# Patient Record
Sex: Female | Born: 1975 | Race: Black or African American | Hispanic: No | Smoking: Former smoker
Health system: Southern US, Community
[De-identification: ages and names within clinical notes are randomized; demographics above are authoritative.]

## PROBLEM LIST (undated history)

## (undated) DIAGNOSIS — D573 Sickle-cell trait: Secondary | ICD-10-CM

## (undated) HISTORY — DX: Sickle-cell trait: D57.3

---

## 1997-09-15 ENCOUNTER — Emergency Department (HOSPITAL_COMMUNITY): Admission: EM | Admit: 1997-09-15 | Discharge: 1997-09-15 | Payer: Self-pay | Admitting: Emergency Medicine

## 1998-01-27 HISTORY — PX: CHOLECYSTECTOMY: SHX55

## 1998-02-07 ENCOUNTER — Ambulatory Visit (HOSPITAL_COMMUNITY): Admission: RE | Admit: 1998-02-07 | Discharge: 1998-02-07 | Payer: Self-pay | Admitting: Obstetrics

## 1998-04-20 ENCOUNTER — Inpatient Hospital Stay (HOSPITAL_COMMUNITY): Admission: AD | Admit: 1998-04-20 | Discharge: 1998-04-20 | Payer: Self-pay | Admitting: Obstetrics & Gynecology

## 1998-05-14 ENCOUNTER — Encounter: Payer: Self-pay | Admitting: Emergency Medicine

## 1998-05-14 ENCOUNTER — Emergency Department (HOSPITAL_COMMUNITY): Admission: EM | Admit: 1998-05-14 | Discharge: 1998-05-14 | Payer: Self-pay | Admitting: Emergency Medicine

## 1998-05-15 ENCOUNTER — Emergency Department (HOSPITAL_COMMUNITY): Admission: EM | Admit: 1998-05-15 | Discharge: 1998-05-15 | Payer: Self-pay | Admitting: Emergency Medicine

## 1998-07-05 ENCOUNTER — Inpatient Hospital Stay (HOSPITAL_COMMUNITY): Admission: AD | Admit: 1998-07-05 | Discharge: 1998-07-05 | Payer: Self-pay | Admitting: *Deleted

## 1998-07-07 ENCOUNTER — Inpatient Hospital Stay (HOSPITAL_COMMUNITY): Admission: AD | Admit: 1998-07-07 | Discharge: 1998-07-10 | Payer: Self-pay | Admitting: *Deleted

## 1998-07-21 ENCOUNTER — Emergency Department (HOSPITAL_COMMUNITY): Admission: EM | Admit: 1998-07-21 | Discharge: 1998-07-21 | Payer: Self-pay | Admitting: Emergency Medicine

## 1999-01-12 ENCOUNTER — Emergency Department (HOSPITAL_COMMUNITY): Admission: EM | Admit: 1999-01-12 | Discharge: 1999-01-12 | Payer: Self-pay | Admitting: Emergency Medicine

## 1999-03-18 ENCOUNTER — Emergency Department (HOSPITAL_COMMUNITY): Admission: EM | Admit: 1999-03-18 | Discharge: 1999-03-18 | Payer: Self-pay | Admitting: Emergency Medicine

## 1999-04-12 ENCOUNTER — Emergency Department (HOSPITAL_COMMUNITY): Admission: EM | Admit: 1999-04-12 | Discharge: 1999-04-12 | Payer: Self-pay | Admitting: Emergency Medicine

## 1999-10-22 ENCOUNTER — Inpatient Hospital Stay (HOSPITAL_COMMUNITY): Admission: AD | Admit: 1999-10-22 | Discharge: 1999-10-22 | Payer: Self-pay | Admitting: *Deleted

## 1999-10-23 ENCOUNTER — Inpatient Hospital Stay (HOSPITAL_COMMUNITY): Admission: RE | Admit: 1999-10-23 | Discharge: 1999-10-23 | Payer: Self-pay | Admitting: *Deleted

## 2000-02-10 ENCOUNTER — Inpatient Hospital Stay (HOSPITAL_COMMUNITY): Admission: AD | Admit: 2000-02-10 | Discharge: 2000-02-10 | Payer: Self-pay | Admitting: Obstetrics

## 2000-02-11 ENCOUNTER — Inpatient Hospital Stay (HOSPITAL_COMMUNITY): Admission: RE | Admit: 2000-02-11 | Discharge: 2000-02-11 | Payer: Self-pay | Admitting: *Deleted

## 2000-03-04 ENCOUNTER — Ambulatory Visit (HOSPITAL_COMMUNITY): Admission: RE | Admit: 2000-03-04 | Discharge: 2000-03-04 | Payer: Self-pay | Admitting: *Deleted

## 2000-03-06 ENCOUNTER — Emergency Department (HOSPITAL_COMMUNITY): Admission: EM | Admit: 2000-03-06 | Discharge: 2000-03-06 | Payer: Self-pay | Admitting: Emergency Medicine

## 2000-03-17 ENCOUNTER — Inpatient Hospital Stay (HOSPITAL_COMMUNITY): Admission: AD | Admit: 2000-03-17 | Discharge: 2000-03-17 | Payer: Self-pay | Admitting: *Deleted

## 2000-03-20 ENCOUNTER — Inpatient Hospital Stay (HOSPITAL_COMMUNITY): Admission: AD | Admit: 2000-03-20 | Discharge: 2000-03-20 | Payer: Self-pay | Admitting: Obstetrics & Gynecology

## 2000-03-22 ENCOUNTER — Inpatient Hospital Stay (HOSPITAL_COMMUNITY): Admission: AD | Admit: 2000-03-22 | Discharge: 2000-03-22 | Payer: Self-pay | Admitting: *Deleted

## 2000-03-23 ENCOUNTER — Inpatient Hospital Stay (HOSPITAL_COMMUNITY): Admission: AD | Admit: 2000-03-23 | Discharge: 2000-03-25 | Payer: Self-pay | Admitting: Obstetrics & Gynecology

## 2000-05-14 ENCOUNTER — Emergency Department (HOSPITAL_COMMUNITY): Admission: EM | Admit: 2000-05-14 | Discharge: 2000-05-14 | Payer: Self-pay | Admitting: Emergency Medicine

## 2000-07-22 ENCOUNTER — Emergency Department (HOSPITAL_COMMUNITY): Admission: EM | Admit: 2000-07-22 | Discharge: 2000-07-22 | Payer: Self-pay | Admitting: Emergency Medicine

## 2002-06-18 ENCOUNTER — Encounter (INDEPENDENT_AMBULATORY_CARE_PROVIDER_SITE_OTHER): Payer: Self-pay

## 2002-06-19 ENCOUNTER — Encounter: Payer: Self-pay | Admitting: Obstetrics and Gynecology

## 2002-06-19 ENCOUNTER — Inpatient Hospital Stay (HOSPITAL_COMMUNITY): Admission: AD | Admit: 2002-06-19 | Discharge: 2002-06-19 | Payer: Self-pay | Admitting: *Deleted

## 2003-02-21 ENCOUNTER — Inpatient Hospital Stay (HOSPITAL_COMMUNITY): Admission: AD | Admit: 2003-02-21 | Discharge: 2003-02-21 | Payer: Self-pay | Admitting: *Deleted

## 2003-11-26 ENCOUNTER — Inpatient Hospital Stay (HOSPITAL_COMMUNITY): Admission: AD | Admit: 2003-11-26 | Discharge: 2003-11-26 | Payer: Self-pay | Admitting: Obstetrics and Gynecology

## 2004-08-07 ENCOUNTER — Emergency Department (HOSPITAL_COMMUNITY): Admission: EM | Admit: 2004-08-07 | Discharge: 2004-08-08 | Payer: Self-pay | Admitting: Emergency Medicine

## 2005-01-10 ENCOUNTER — Emergency Department (HOSPITAL_COMMUNITY): Admission: EM | Admit: 2005-01-10 | Discharge: 2005-01-10 | Payer: Self-pay | Admitting: Emergency Medicine

## 2005-02-03 ENCOUNTER — Inpatient Hospital Stay (HOSPITAL_COMMUNITY): Admission: AD | Admit: 2005-02-03 | Discharge: 2005-02-03 | Payer: Self-pay | Admitting: Family Medicine

## 2005-03-18 ENCOUNTER — Inpatient Hospital Stay (HOSPITAL_COMMUNITY): Admission: AD | Admit: 2005-03-18 | Discharge: 2005-03-18 | Payer: Self-pay | Admitting: *Deleted

## 2005-04-02 ENCOUNTER — Encounter (INDEPENDENT_AMBULATORY_CARE_PROVIDER_SITE_OTHER): Payer: Self-pay | Admitting: *Deleted

## 2005-04-03 ENCOUNTER — Ambulatory Visit: Payer: Self-pay | Admitting: Family Medicine

## 2005-04-10 ENCOUNTER — Ambulatory Visit: Payer: Self-pay | Admitting: Family Medicine

## 2005-04-14 ENCOUNTER — Ambulatory Visit (HOSPITAL_COMMUNITY): Admission: RE | Admit: 2005-04-14 | Discharge: 2005-04-14 | Payer: Self-pay | Admitting: Family Medicine

## 2005-05-09 ENCOUNTER — Ambulatory Visit (HOSPITAL_COMMUNITY): Admission: RE | Admit: 2005-05-09 | Discharge: 2005-05-09 | Payer: Self-pay | Admitting: Family Medicine

## 2005-05-13 ENCOUNTER — Ambulatory Visit (HOSPITAL_COMMUNITY): Admission: RE | Admit: 2005-05-13 | Discharge: 2005-05-13 | Payer: Self-pay | Admitting: *Deleted

## 2005-05-13 ENCOUNTER — Ambulatory Visit: Payer: Self-pay | Admitting: *Deleted

## 2005-05-15 ENCOUNTER — Encounter (INDEPENDENT_AMBULATORY_CARE_PROVIDER_SITE_OTHER): Payer: Self-pay | Admitting: Specialist

## 2005-05-15 ENCOUNTER — Ambulatory Visit (HOSPITAL_COMMUNITY): Admission: RE | Admit: 2005-05-15 | Discharge: 2005-05-15 | Payer: Self-pay | Admitting: Gynecology

## 2005-05-15 ENCOUNTER — Ambulatory Visit: Payer: Self-pay | Admitting: Gynecology

## 2005-10-08 ENCOUNTER — Ambulatory Visit: Payer: Self-pay | Admitting: Sports Medicine

## 2005-10-21 ENCOUNTER — Ambulatory Visit: Payer: Self-pay | Admitting: Family Medicine

## 2005-10-23 ENCOUNTER — Ambulatory Visit (HOSPITAL_COMMUNITY): Admission: RE | Admit: 2005-10-23 | Discharge: 2005-10-23 | Payer: Self-pay | Admitting: Gynecology

## 2005-10-30 ENCOUNTER — Ambulatory Visit: Payer: Self-pay | Admitting: Gynecology

## 2005-11-13 ENCOUNTER — Ambulatory Visit: Payer: Self-pay | Admitting: Family Medicine

## 2005-11-27 ENCOUNTER — Ambulatory Visit (HOSPITAL_COMMUNITY): Admission: RE | Admit: 2005-11-27 | Discharge: 2005-11-27 | Payer: Self-pay | Admitting: Gynecology

## 2005-12-11 ENCOUNTER — Ambulatory Visit: Payer: Self-pay | Admitting: Family Medicine

## 2006-01-01 ENCOUNTER — Ambulatory Visit: Payer: Self-pay | Admitting: *Deleted

## 2006-02-26 ENCOUNTER — Ambulatory Visit: Payer: Self-pay | Admitting: *Deleted

## 2006-03-18 ENCOUNTER — Inpatient Hospital Stay (HOSPITAL_COMMUNITY): Admission: AD | Admit: 2006-03-18 | Discharge: 2006-03-18 | Payer: Self-pay | Admitting: Obstetrics & Gynecology

## 2006-03-26 DIAGNOSIS — D573 Sickle-cell trait: Secondary | ICD-10-CM | POA: Insufficient documentation

## 2006-03-27 ENCOUNTER — Encounter (INDEPENDENT_AMBULATORY_CARE_PROVIDER_SITE_OTHER): Payer: Self-pay | Admitting: *Deleted

## 2006-04-13 ENCOUNTER — Emergency Department (HOSPITAL_COMMUNITY): Admission: EM | Admit: 2006-04-13 | Discharge: 2006-04-13 | Payer: Self-pay | Admitting: Emergency Medicine

## 2006-04-15 ENCOUNTER — Ambulatory Visit: Payer: Self-pay | Admitting: *Deleted

## 2006-04-21 ENCOUNTER — Inpatient Hospital Stay (HOSPITAL_COMMUNITY): Admission: AD | Admit: 2006-04-21 | Discharge: 2006-04-22 | Payer: Self-pay | Admitting: Obstetrics & Gynecology

## 2006-04-21 ENCOUNTER — Ambulatory Visit: Payer: Self-pay | Admitting: Obstetrics and Gynecology

## 2006-08-04 ENCOUNTER — Emergency Department (HOSPITAL_COMMUNITY): Admission: EM | Admit: 2006-08-04 | Discharge: 2006-08-04 | Payer: Self-pay | Admitting: Emergency Medicine

## 2006-08-10 ENCOUNTER — Encounter (INDEPENDENT_AMBULATORY_CARE_PROVIDER_SITE_OTHER): Payer: Self-pay | Admitting: *Deleted

## 2006-09-28 ENCOUNTER — Emergency Department (HOSPITAL_COMMUNITY): Admission: EM | Admit: 2006-09-28 | Discharge: 2006-09-28 | Payer: Self-pay | Admitting: Family Medicine

## 2006-12-26 ENCOUNTER — Emergency Department (HOSPITAL_COMMUNITY): Admission: EM | Admit: 2006-12-26 | Discharge: 2006-12-26 | Payer: Self-pay

## 2006-12-30 ENCOUNTER — Emergency Department (HOSPITAL_COMMUNITY): Admission: EM | Admit: 2006-12-30 | Discharge: 2006-12-30 | Payer: Self-pay | Admitting: Family Medicine

## 2007-01-26 ENCOUNTER — Inpatient Hospital Stay (HOSPITAL_COMMUNITY): Admission: AD | Admit: 2007-01-26 | Discharge: 2007-01-27 | Payer: Self-pay | Admitting: Obstetrics & Gynecology

## 2007-02-07 ENCOUNTER — Emergency Department (HOSPITAL_COMMUNITY): Admission: EM | Admit: 2007-02-07 | Discharge: 2007-02-07 | Payer: Self-pay | Admitting: Emergency Medicine

## 2007-03-11 ENCOUNTER — Ambulatory Visit: Payer: Self-pay | Admitting: Family Medicine

## 2007-03-11 ENCOUNTER — Encounter: Payer: Self-pay | Admitting: Family Medicine

## 2007-03-11 LAB — CONVERTED CEMR LAB
Basophils Absolute: 0 10*3/uL (ref 0.0–0.1)
Basophils Relative: 0 % (ref 0–1)
Eosinophils Relative: 2 % (ref 0–5)
Hemoglobin: 10.4 g/dL — ABNORMAL LOW (ref 12.0–15.0)
Hepatitis B Surface Ag: NEGATIVE
Lymphs Abs: 2.8 10*3/uL (ref 0.7–4.0)
Monocytes Relative: 7 % (ref 3–12)
Neutro Abs: 9.8 10*3/uL — ABNORMAL HIGH (ref 1.7–7.7)
Platelets: 339 10*3/uL (ref 150–400)
RDW: 16.8 % — ABNORMAL HIGH (ref 11.5–15.5)
Rubella: 61 intl units/mL — ABNORMAL HIGH

## 2007-03-23 ENCOUNTER — Encounter: Payer: Self-pay | Admitting: Family Medicine

## 2007-03-23 ENCOUNTER — Other Ambulatory Visit: Admission: RE | Admit: 2007-03-23 | Discharge: 2007-03-23 | Payer: Self-pay | Admitting: Obstetrics and Gynecology

## 2007-03-23 ENCOUNTER — Ambulatory Visit: Payer: Self-pay | Admitting: Family Medicine

## 2007-03-23 LAB — CONVERTED CEMR LAB: Protein, U semiquant: NEGATIVE

## 2007-03-24 ENCOUNTER — Encounter: Payer: Self-pay | Admitting: Family Medicine

## 2007-03-25 ENCOUNTER — Encounter: Payer: Self-pay | Admitting: Family Medicine

## 2007-03-26 ENCOUNTER — Telehealth: Payer: Self-pay | Admitting: Family Medicine

## 2007-03-31 ENCOUNTER — Ambulatory Visit: Payer: Self-pay | Admitting: Family Medicine

## 2007-04-02 ENCOUNTER — Telehealth: Payer: Self-pay | Admitting: Family Medicine

## 2007-04-02 ENCOUNTER — Encounter: Payer: Self-pay | Admitting: Family Medicine

## 2007-04-05 ENCOUNTER — Telehealth: Payer: Self-pay | Admitting: Family Medicine

## 2007-04-06 ENCOUNTER — Encounter: Payer: Self-pay | Admitting: Family Medicine

## 2007-04-06 ENCOUNTER — Telehealth (INDEPENDENT_AMBULATORY_CARE_PROVIDER_SITE_OTHER): Payer: Self-pay | Admitting: Family Medicine

## 2007-04-06 ENCOUNTER — Ambulatory Visit (HOSPITAL_COMMUNITY): Admission: RE | Admit: 2007-04-06 | Discharge: 2007-04-06 | Payer: Self-pay | Admitting: Family Medicine

## 2007-04-08 ENCOUNTER — Telehealth (INDEPENDENT_AMBULATORY_CARE_PROVIDER_SITE_OTHER): Payer: Self-pay | Admitting: Family Medicine

## 2007-04-14 ENCOUNTER — Ambulatory Visit (HOSPITAL_COMMUNITY): Admission: RE | Admit: 2007-04-14 | Discharge: 2007-04-14 | Payer: Self-pay | Admitting: Family Medicine

## 2007-04-21 ENCOUNTER — Ambulatory Visit (HOSPITAL_COMMUNITY): Admission: RE | Admit: 2007-04-21 | Discharge: 2007-04-21 | Payer: Self-pay | Admitting: Family Medicine

## 2007-04-22 ENCOUNTER — Encounter: Payer: Self-pay | Admitting: Family Medicine

## 2007-04-29 ENCOUNTER — Encounter: Payer: Self-pay | Admitting: Family Medicine

## 2007-05-12 ENCOUNTER — Ambulatory Visit (HOSPITAL_COMMUNITY): Admission: RE | Admit: 2007-05-12 | Discharge: 2007-05-12 | Payer: Self-pay | Admitting: Family Medicine

## 2007-05-12 ENCOUNTER — Encounter: Payer: Self-pay | Admitting: Family Medicine

## 2007-05-24 ENCOUNTER — Encounter: Payer: Self-pay | Admitting: *Deleted

## 2007-06-10 ENCOUNTER — Ambulatory Visit: Payer: Self-pay | Admitting: Family Medicine

## 2007-06-10 ENCOUNTER — Encounter: Payer: Self-pay | Admitting: Family Medicine

## 2007-06-10 LAB — CONVERTED CEMR LAB: Protein, U semiquant: NEGATIVE

## 2007-06-14 ENCOUNTER — Ambulatory Visit: Payer: Self-pay | Admitting: Family Medicine

## 2007-06-14 ENCOUNTER — Encounter: Payer: Self-pay | Admitting: Family Medicine

## 2007-06-15 ENCOUNTER — Telehealth: Payer: Self-pay | Admitting: Family Medicine

## 2007-06-16 ENCOUNTER — Encounter: Payer: Self-pay | Admitting: Family Medicine

## 2007-06-24 ENCOUNTER — Encounter: Payer: Self-pay | Admitting: Family Medicine

## 2007-06-24 ENCOUNTER — Ambulatory Visit (HOSPITAL_COMMUNITY): Admission: RE | Admit: 2007-06-24 | Discharge: 2007-06-24 | Payer: Self-pay | Admitting: Family Medicine

## 2007-07-19 ENCOUNTER — Telehealth (INDEPENDENT_AMBULATORY_CARE_PROVIDER_SITE_OTHER): Payer: Self-pay | Admitting: *Deleted

## 2007-07-22 ENCOUNTER — Ambulatory Visit (HOSPITAL_COMMUNITY): Admission: RE | Admit: 2007-07-22 | Discharge: 2007-07-22 | Payer: Self-pay | Admitting: Family Medicine

## 2007-07-27 ENCOUNTER — Ambulatory Visit: Payer: Self-pay | Admitting: Obstetrics and Gynecology

## 2007-07-27 ENCOUNTER — Inpatient Hospital Stay (HOSPITAL_COMMUNITY): Admission: AD | Admit: 2007-07-27 | Discharge: 2007-07-27 | Payer: Self-pay | Admitting: Obstetrics & Gynecology

## 2007-07-27 ENCOUNTER — Encounter: Payer: Self-pay | Admitting: Family Medicine

## 2007-08-09 ENCOUNTER — Encounter (INDEPENDENT_AMBULATORY_CARE_PROVIDER_SITE_OTHER): Payer: Self-pay | Admitting: *Deleted

## 2007-08-11 ENCOUNTER — Encounter: Payer: Self-pay | Admitting: Family Medicine

## 2007-08-19 ENCOUNTER — Ambulatory Visit (HOSPITAL_COMMUNITY): Admission: RE | Admit: 2007-08-19 | Discharge: 2007-08-19 | Payer: Self-pay | Admitting: Family Medicine

## 2007-08-20 ENCOUNTER — Encounter: Payer: Self-pay | Admitting: Family Medicine

## 2007-08-24 ENCOUNTER — Encounter: Payer: Self-pay | Admitting: Family Medicine

## 2007-08-30 ENCOUNTER — Ambulatory Visit: Payer: Self-pay | Admitting: Family Medicine

## 2007-08-30 ENCOUNTER — Encounter: Payer: Self-pay | Admitting: Family Medicine

## 2007-08-30 LAB — CONVERTED CEMR LAB
Chlamydia, DNA Probe: NEGATIVE
Glucose, Urine, Semiquant: NEGATIVE

## 2007-08-31 ENCOUNTER — Encounter: Payer: Self-pay | Admitting: Family Medicine

## 2007-09-09 ENCOUNTER — Ambulatory Visit (HOSPITAL_COMMUNITY): Admission: RE | Admit: 2007-09-09 | Discharge: 2007-09-09 | Payer: Self-pay | Admitting: Family Medicine

## 2007-09-09 ENCOUNTER — Encounter: Payer: Self-pay | Admitting: Family Medicine

## 2007-09-18 ENCOUNTER — Ambulatory Visit: Payer: Self-pay | Admitting: Physician Assistant

## 2007-09-18 ENCOUNTER — Inpatient Hospital Stay (HOSPITAL_COMMUNITY): Admission: AD | Admit: 2007-09-18 | Discharge: 2007-09-20 | Payer: Self-pay | Admitting: Family Medicine

## 2007-09-20 ENCOUNTER — Encounter: Payer: Self-pay | Admitting: Family Medicine

## 2007-09-24 ENCOUNTER — Encounter: Payer: Self-pay | Admitting: *Deleted

## 2007-10-27 ENCOUNTER — Encounter: Payer: Self-pay | Admitting: Family Medicine

## 2007-11-10 ENCOUNTER — Encounter: Payer: Self-pay | Admitting: Family Medicine

## 2007-11-13 ENCOUNTER — Encounter: Payer: Self-pay | Admitting: Family Medicine

## 2007-11-25 ENCOUNTER — Encounter: Payer: Self-pay | Admitting: Family Medicine

## 2007-12-13 ENCOUNTER — Ambulatory Visit: Payer: Self-pay | Admitting: Family Medicine

## 2007-12-13 ENCOUNTER — Encounter: Payer: Self-pay | Admitting: Family Medicine

## 2007-12-13 ENCOUNTER — Other Ambulatory Visit: Admission: RE | Admit: 2007-12-13 | Discharge: 2007-12-13 | Payer: Self-pay | Admitting: Family Medicine

## 2007-12-13 DIAGNOSIS — E669 Obesity, unspecified: Secondary | ICD-10-CM | POA: Insufficient documentation

## 2007-12-13 LAB — CONVERTED CEMR LAB
Bilirubin Urine: NEGATIVE
Blood in Urine, dipstick: NEGATIVE
Chlamydia, DNA Probe: NEGATIVE
Cholesterol: 206 mg/dL — ABNORMAL HIGH (ref 0–200)
GC Probe Amp, Genital: NEGATIVE
Ketones, urine, test strip: NEGATIVE
Nitrite: NEGATIVE
Specific Gravity, Urine: 1.015
Urobilinogen, UA: 0.2

## 2007-12-14 ENCOUNTER — Encounter: Payer: Self-pay | Admitting: Family Medicine

## 2007-12-15 ENCOUNTER — Encounter: Payer: Self-pay | Admitting: Family Medicine

## 2007-12-16 ENCOUNTER — Encounter: Payer: Self-pay | Admitting: Family Medicine

## 2008-05-28 ENCOUNTER — Encounter: Payer: Self-pay | Admitting: Family Medicine

## 2008-05-30 ENCOUNTER — Emergency Department (HOSPITAL_COMMUNITY): Admission: EM | Admit: 2008-05-30 | Discharge: 2008-05-30 | Payer: Self-pay | Admitting: Emergency Medicine

## 2008-06-20 ENCOUNTER — Emergency Department (HOSPITAL_COMMUNITY): Admission: EM | Admit: 2008-06-20 | Discharge: 2008-06-21 | Payer: Self-pay | Admitting: Emergency Medicine

## 2008-07-06 ENCOUNTER — Encounter: Payer: Self-pay | Admitting: *Deleted

## 2008-07-06 ENCOUNTER — Ambulatory Visit: Payer: Self-pay | Admitting: Family Medicine

## 2008-07-06 ENCOUNTER — Encounter: Payer: Self-pay | Admitting: Family Medicine

## 2008-07-06 DIAGNOSIS — K8020 Calculus of gallbladder without cholecystitis without obstruction: Secondary | ICD-10-CM | POA: Insufficient documentation

## 2008-07-07 ENCOUNTER — Encounter: Payer: Self-pay | Admitting: *Deleted

## 2008-11-06 ENCOUNTER — Encounter (INDEPENDENT_AMBULATORY_CARE_PROVIDER_SITE_OTHER): Payer: Self-pay | Admitting: General Surgery

## 2008-11-06 ENCOUNTER — Ambulatory Visit (HOSPITAL_COMMUNITY): Admission: RE | Admit: 2008-11-06 | Discharge: 2008-11-06 | Payer: Self-pay | Admitting: General Surgery

## 2009-05-27 ENCOUNTER — Emergency Department (HOSPITAL_COMMUNITY): Admission: EM | Admit: 2009-05-27 | Discharge: 2009-05-28 | Payer: Self-pay | Admitting: Emergency Medicine

## 2009-07-02 ENCOUNTER — Inpatient Hospital Stay (HOSPITAL_COMMUNITY): Admission: AD | Admit: 2009-07-02 | Discharge: 2009-07-02 | Payer: Self-pay | Admitting: Family Medicine

## 2009-08-21 ENCOUNTER — Telehealth: Payer: Self-pay | Admitting: Family Medicine

## 2009-08-30 ENCOUNTER — Encounter: Payer: Self-pay | Admitting: Family Medicine

## 2009-08-30 ENCOUNTER — Ambulatory Visit: Payer: Self-pay | Admitting: Family Medicine

## 2009-08-30 LAB — CONVERTED CEMR LAB
Antibody Screen: NEGATIVE
Basophils Relative: 0 % (ref 0–1)
Eosinophils Absolute: 0.2 10*3/uL (ref 0.0–0.7)
Eosinophils Relative: 2 % (ref 0–5)
HCT: 29.8 % — ABNORMAL LOW (ref 36.0–46.0)
Lymphs Abs: 1.8 10*3/uL (ref 0.7–4.0)
MCHC: 30.5 g/dL (ref 30.0–36.0)
MCV: 69.8 fL — ABNORMAL LOW (ref 78.0–100.0)
Monocytes Absolute: 1 10*3/uL (ref 0.1–1.0)
Monocytes Relative: 10 % (ref 3–12)
RBC: 4.27 M/uL (ref 3.87–5.11)
Rh Type: POSITIVE
Sickle Cell Screen: POSITIVE — AB
WBC: 10 10*3/uL (ref 4.0–10.5)

## 2009-09-05 ENCOUNTER — Ambulatory Visit: Payer: Self-pay | Admitting: Family Medicine

## 2009-09-05 DIAGNOSIS — O09529 Supervision of elderly multigravida, unspecified trimester: Secondary | ICD-10-CM | POA: Insufficient documentation

## 2009-09-05 LAB — CONVERTED CEMR LAB

## 2009-09-07 ENCOUNTER — Inpatient Hospital Stay (HOSPITAL_COMMUNITY): Admission: AD | Admit: 2009-09-07 | Discharge: 2009-09-07 | Payer: Self-pay | Admitting: Family Medicine

## 2009-09-07 ENCOUNTER — Ambulatory Visit: Payer: Self-pay | Admitting: Advanced Practice Midwife

## 2009-09-12 ENCOUNTER — Encounter: Payer: Self-pay | Admitting: Family Medicine

## 2009-09-13 ENCOUNTER — Encounter: Payer: Self-pay | Admitting: *Deleted

## 2009-09-14 ENCOUNTER — Encounter (INDEPENDENT_AMBULATORY_CARE_PROVIDER_SITE_OTHER): Payer: Self-pay | Admitting: *Deleted

## 2009-09-17 ENCOUNTER — Encounter: Payer: Self-pay | Admitting: Family Medicine

## 2009-09-20 ENCOUNTER — Encounter: Payer: Self-pay | Admitting: Family Medicine

## 2009-09-20 ENCOUNTER — Ambulatory Visit: Payer: Self-pay | Admitting: Obstetrics and Gynecology

## 2009-09-27 ENCOUNTER — Ambulatory Visit: Payer: Self-pay | Admitting: Obstetrics and Gynecology

## 2009-10-04 ENCOUNTER — Ambulatory Visit: Payer: Self-pay | Admitting: Obstetrics & Gynecology

## 2009-10-11 ENCOUNTER — Ambulatory Visit: Payer: Self-pay | Admitting: Family Medicine

## 2009-10-11 LAB — CONVERTED CEMR LAB: Chlamydia, DNA Probe: NEGATIVE

## 2009-10-12 ENCOUNTER — Encounter: Payer: Self-pay | Admitting: Family Medicine

## 2009-10-22 ENCOUNTER — Ambulatory Visit: Payer: Self-pay | Admitting: Obstetrics & Gynecology

## 2009-10-29 ENCOUNTER — Ambulatory Visit: Payer: Self-pay | Admitting: Obstetrics & Gynecology

## 2009-10-30 ENCOUNTER — Ambulatory Visit: Payer: Self-pay | Admitting: Advanced Practice Midwife

## 2009-10-30 ENCOUNTER — Inpatient Hospital Stay (HOSPITAL_COMMUNITY): Admission: AD | Admit: 2009-10-30 | Discharge: 2009-11-02 | Payer: Self-pay | Admitting: Obstetrics & Gynecology

## 2010-01-14 ENCOUNTER — Emergency Department (HOSPITAL_COMMUNITY)
Admission: EM | Admit: 2010-01-14 | Discharge: 2010-01-14 | Payer: Self-pay | Source: Home / Self Care | Admitting: Emergency Medicine

## 2010-02-17 ENCOUNTER — Encounter: Payer: Self-pay | Admitting: Family Medicine

## 2010-02-26 NOTE — Assessment & Plan Note (Signed)
Summary: NOB/DSL   Vital Signs:  Patient profile:   35 year old female LMP:     12/27/2008 Height:      63.25 inches Weight:      221 pounds BMI:     38.98 BSA:     2.03 Pulse rate:   91 / minute BP sitting:   106 / 74  Vitals Entered By: Jone Baseman CMA (September 05, 2009 2:00 PM) CC: NOB Is Patient Diabetic? No Pain Assessment Patient in pain? no      LMP (date): 12/27/2008 EDC by LMP==> 10/03/2009 LMP - Character: normal LMP - Reliable? No Menarche (age onset years): 15   Menses interval (days): irreg Menstrual flow (days): irreg On BCP's at conception: no Date of + home preg. test: 01/26/2007 Enter LMP: 12/27/2008 Last PAP Result NEGATIVE FOR INTRAEPITHELIAL LESIONS OR MALIGNANCY.   CC:  NOB.  Habits & Providers  Alcohol-Tobacco-Diet     Cigarette Packs/Day: denies  Current Medications (verified): 1)  Prenatal/folic Acid  Tabs (Prenatal Vit-Fe Fumarate-Fa) .... Take 1 Pill By Mouth Daily 2)  Iron Supplement 325 (65 Fe) Mg Tabs (Ferrous Sulfate) .... Take 1 Pill By Mouth Bid  Allergies (verified): 1)  ! Penicillin  Past History:  Past Surgical History: D&C - 05/09/2005 Lap Chole - 11/06/2008 Cholecystectomy (2010)  Social History: Lives with 7 childrenEducation:  12 Hepatitis Risk:  no Packs/Day:  denies   Impression & Recommendations:  Problem # 1:  INSUFFICIENT PRENATAL CARE (ICD-V23.7) Assessment New New OB pt, no prenatal care. 35 yo [id number] at unknown gestational age.  Pt with very unsure LMP. Pt reports last depo shot in 08/2008.  No preiod since 08/2008.  Presents today for new OB visit.  Was seen at [facility] in 06/2009 and had a positive urine preg test; but no ultrasound was done at that time. Pt failed to follow up with anyone after inital [facility] visit. Today, pt reports fetal movement that has been present for 4-5 months and she estimates herself to be about 7-8 months pregnant.  Multiple risk factors: 1. AMA 2. No prenatal care,  unknown gestational age 68. Sickle cell trait 4. 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 Mauricio Po Pt needs ultrasound and referral to high risk clinic.  Pt has applied for medicaid but has not been approved yet.  She is not willing to pay for an ultrasound.  Called high risk clinic to schedule an appointment.  Spoke with [place] who stated she would have Antoniette call me back as she was currently unavailable.  Advised pt that I could not schedule an appoitnment right now and I would call her ASAP with shceduled time.  She was very upset and left before I could do a cervical exam or STD testing.  She states she is going to [facility] to get her ultrasound NOW.    Orders: Urine Culture-[facility] ([id number]) Miscellaneous Lab Charge-[facility] (08657) Obstetric Referral (Obstetric) Other OB visit- [facility] (OBCK)  Problem # 2:  ADVANCED MATERNAL AGE (ICD-659.60)  Orders: Obstetric Referral (Obstetric) Other OB visit- [facility] (OBCK)  Problem # 3:  OTHER RESPIRATORY PROBLEMS NEWBORN AFTER BIRTH (ICD-770.89)  Orders: Obstetric Referral (Obstetric) Other OB visit- [facility] (OBCK)  Complete Medication List: 1)  Prenatal/folic Acid Tabs (Prenatal vit-fe fumarate-fa) .... Take 1 pill by mouth daily 2)  Iron Supplement 325 (65 Fe) Mg Tabs (Ferrous  sulfate) .... Take 1 pill by mouth bid  Other Orders: Urinalysis-[facility] (00000) Prescriptions: IRON SUPPLEMENT 325 (65 FE) MG TABS (FERROUS SULFATE) take 1 pill by mouth BID  #60 x 3   Entered and Authorized by:   Alvia Grove DO   Signed by:   Alvia Grove DO on 09/05/2009   Method used:   [facility]        [facility] [address] [phone]* (retail)       [address]       [place], [place]  [address]       Ph: [phone] or [phone]       Fax: [phone]   RxID:   [id number] PRENATAL/FOLIC ACID  TABS (PRENATAL VIT-FE  FUMARATE-FA) take 1 pill by mouth daily  #30 x 5   Entered and Authorized by:   Alvia Grove DO   Signed by:   Alvia Grove DO on 09/05/2009   Method used:   [facility]        [facility] [address] [phone]* (retail)       [address]       [place], [place]  [address]       Ph: [phone] or [phone]       Fax: [phone]   RxID:   [id number]    Past Pregnancy History    Gravida:     11    Para:       8  Pregnancy # 1    Hours of labor:     10    Anesthesia type:     none    Birth weight:     6lb19oz  Pregnancy # 2    Anesthesia type:     none    Birth weight:     6lb19oz  Pregnancy # 3    Hours of labor:     10    Anesthesia type:     none    Birth weight:     7lb  Pregnancy # 4    Hours of labor:     10    Anesthesia type:     none    Birth weight:     7lbs  Pregnancy # 5    Hours of labor:     10    Anesthesia type:     none    Birth weight:     7lbs  Pregnancy # 6    Hours of labor:     10    Anesthesia type:     none    Birth weight:     7lbs  Pregnancy # 7    Hours of labor:     10    Birth weight:     8lb10oz    Comments:     post-term, induced  Pregnancy # 9    Hours of labor:     10    Anesthesia type:     none    Birth weight:     7lbs 12oz  Pregnancy # [redacted]    Weeks Gestation:   term    Preterm labor:     no    Delivery type:     NSVD    Hours of labor:     5    Anesthesia type:     none    Delivery location:     [facility]  Infant Sex:     Female    Birth weight:     7lb14oz    Name:     Kathryn Calderon   Pregnancy # 11    Delivery date:     2011    Comments:     current pregnancy   Flowsheet View for Follow-up Visit    Estimated weeks of       gestation:     unknown    Weight:     221    Blood pressure:   106 / 74    Hx headache?     few    Nausea/vomiting?   No    Edema?     0    Bleeding?     no    Leakage/discharge?   no    Fetal activity:       yes    Labor symptoms?   no    FHR:        150's    Taking Vitamins?   Y    Smoking PPD:   denies    Comment:     pt left before I could complete exam or confirm high risk appt    Next visit:     ?    Resident:     Gomez Cleverly    Preceptor:     Link Snuffer Initial Intake Information    Positive HCG by: Lucie Leather    Race: Black    Marital status: Single    Occupation: homemaker    Education (last grade completed): 12    Number of children at home: 8    Hospital of delivery: [facility]  FOB Information    Husband/Father of baby: Tab White    FOB occupation Works at [employer]: [phone]    FOB Comments: Father of last 6 pregnancies (2002, 2005, 2007, 2008, 2009)  Menstrual History    LMP (date): 12/27/2008    EDC by LMP: 10/03/2009    LMP - Character: normal    LMP - Reliable? : No    Menarche: 15 years    Menses interval: irreg days    Menstrual flow irreg days    On BCP's at conception: no    Date of positive (+) home preg. test: 01/26/2007    Pre Pregnancy Weight: 198 lbs.    Symptoms since LMP: amenorrhea, nausea, vomiting, fatigue, bloating, tender breasts Prenatal Visit EDC Confirmation:    LMP reliable? No    Last menses onset (LMP) date: 12/27/2008    EDC by LMP: 10/03/2009   Genetic History    Genetic History Reviewed:    Maternal age:     55    Father of baby:   Tab White     Thalassemia:     mother: no   father: no   comments: 2007 pregnancy with Trisomy 13    Neural tube defect:   mother: no   father: no    Down's Syndrome:   mother: no   father: no    Tay-Sachs:     mother: no   father: no    Sickle Cell Dz/Trait:   mother: yes   father: no    Hemophilia:     mother: no   father: no    Muscular Dystrophy:   mother: no   father: no    Cystic Fibrosis:   mother: no   father: no    Huntington's Dz:   mother: no  father: no    Mental Retardation:   mother: no   father: no    Fragile X:     mother: no   father: no    Other Genetic or       Chromosomal Dz:   mother: yes   father: no    Child with  other       birth defect:     mother: no   father: no    > 3 spont. 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 Gomez Cleverly, agree with referral to [facility] for history prior delivery Trisomy 75 in this 34-yr old multigravida with sickle cell trait, late to prenatal care. Early 1hrGTT for obesity in this AAF patient (unsure if early, given late to prenatal care).

## 2010-02-26 NOTE — Progress Notes (Signed)
Summary: NOB appts  ---- Converted from flag ---- ---- 08/21/2009 2:45 PM, De Nurse wrote: new OB, 6 mo, no prenatal care... [phone] ------------------------------  NOB APPTS SCHEDULED:  08/30/09 @ 10:30AM LAB                                             09/05/09 @ 2:30PM DR. Corrina Steffensen  Pt. notified of appts.  Pt. has applied for Medicaid. Terese Door  August 21, 2009 4:05 PM

## 2010-02-26 NOTE — Letter (Signed)
Summary: Generic Letter  [facility]  [address]   [place], [place] [address]   Phone: [phone]  Fax: [phone]    09/12/2009  MANON BANBURY [address] [place], [place]  [address]  Dear Ms. Broadus,   I reviewed your recent [facility]U visit at [facility] and I noted that you were referred to the high risk clinic for follow up. As we discussed at your first office visit on 09-04-2009, it is essential that you are seen by the high risk clinic as soon as possible. If you do not have an  appointment with the high risk clinic please call [phone] and schedule an appointment as soon as you can. I was unsure if this appointment had been made for you during your [facility]U visit, if it was NOT, please make sure you follow up and call them. I have already sent all of your labs and office information to them. It is very important that you are seen and followed by the High Risk clinic for OB care.         Sincerely,   Alvia Grove DO  Appended Document: Generic Letter discussed with Dr. Mauricio Po.  Pt was seen at [facility]U on 09-07-09.  Was referred to high risk by me on 09-04-09 and again by the MAU on 09-07-09.  I am unsure if she has an appointment yet with them or not.  Myself and Renard Hamper have called high risk and faxed all documentation to them but we have not been able to secure an appointment for the pt yet.  Patient's phone number is not valid.  Pt does not have any more appointments scheduled with [facility].  I will conitnue to work hard to get her established with high risk clinic.  Alvia Grove  Appended Document: Generic Letter mailed certified.

## 2010-02-26 NOTE — Miscellaneous (Signed)
Summary: has appt at [facility]  Clinical Lists Changes appt is 09/20/09 at 8:45. unable to reach pt by phone. appt letter sent to pt.Golden Circle RN  September 13, 2009 2:56 PM

## 2010-02-26 NOTE — Miscellaneous (Signed)
Summary: Mental Health Consult, P-LCSW  Mental Health Consultation, Marcelle Smiling MSW  September 14, 2009 11:15 AM  Consulted with Dr. Gomez Cleverly about this patient earlier this week. Made referrals for [facility] and OB Case manager.

## 2010-02-26 NOTE — Miscellaneous (Signed)
Summary: cannot refer pt for [facility].has to be a child  Clinical Lists Changes rec'd call from the case manager for [facility]. it has ms. Ullom's name on it. thisis for children under age 36. if we want to refer a child of this pt, re-submit with child's name.Golden Circle RN  September 17, 2009 4:59 PM

## 2010-04-08 LAB — URINALYSIS, ROUTINE W REFLEX MICROSCOPIC
Glucose, UA: NEGATIVE mg/dL
Ketones, ur: NEGATIVE mg/dL
pH: 6 (ref 5.0–8.0)

## 2010-04-08 LAB — CBC
HCT: 38.1 % (ref 36.0–46.0)
Hemoglobin: 12.6 g/dL (ref 12.0–15.0)
MCV: 77.6 fL — ABNORMAL LOW (ref 78.0–100.0)
RBC: 4.91 MIL/uL (ref 3.87–5.11)
RDW: 17.8 % — ABNORMAL HIGH (ref 11.5–15.5)
WBC: 12.1 10*3/uL — ABNORMAL HIGH (ref 4.0–10.5)

## 2010-04-08 LAB — BASIC METABOLIC PANEL
Chloride: 108 mEq/L (ref 96–112)
GFR calc Af Amer: >60 mL/min (ref >60)
GFR calc non Af Amer: >60 mL/min (ref >60)
Potassium: 3.5 mEq/L (ref 3.5–5.1)
Sodium: 141 mEq/L (ref 135–145)

## 2010-04-08 LAB — DIFFERENTIAL
Basophils Absolute: 0 10*3/uL (ref 0.0–0.1)
Eosinophils Relative: 1 % (ref 0–5)
Lymphocytes Relative: 17 % (ref 12–46)
Lymphs Abs: 2.1 10*3/uL (ref 0.7–4.0)
Monocytes Absolute: 0.7 10*3/uL (ref 0.1–1.0)
Neutro Abs: 9.2 10*3/uL — ABNORMAL HIGH (ref 1.7–7.7)

## 2010-04-10 LAB — CBC
HCT: 34.7 % — ABNORMAL LOW (ref 36.0–46.0)
MCH: 23.5 pg — ABNORMAL LOW (ref 26.0–34.0)
MCV: 74.2 fL — ABNORMAL LOW (ref 78.0–100.0)
RBC: 4.68 MIL/uL (ref 3.87–5.11)
WBC: 11.9 10*3/uL — ABNORMAL HIGH (ref 4.0–10.5)

## 2010-04-10 LAB — POCT URINALYSIS DIPSTICK
Glucose, UA: NEGATIVE mg/dL
Hgb urine dipstick: NEGATIVE
Ketones, ur: NEGATIVE mg/dL
Protein, ur: NEGATIVE mg/dL
Specific Gravity, Urine: 1.015 (ref 1.005–1.030)
Urobilinogen, UA: 0.2 mg/dL (ref 0.0–1.0)

## 2010-04-11 LAB — POCT URINALYSIS DIPSTICK
Bilirubin Urine: NEGATIVE
Bilirubin Urine: NEGATIVE
Hgb urine dipstick: NEGATIVE
Hgb urine dipstick: NEGATIVE
Hgb urine dipstick: NEGATIVE
Hgb urine dipstick: NEGATIVE
Ketones, ur: 40 mg/dL — AB
Ketones, ur: NEGATIVE mg/dL
Ketones, ur: NEGATIVE mg/dL
Nitrite: NEGATIVE
Nitrite: NEGATIVE
Protein, ur: NEGATIVE mg/dL
Protein, ur: NEGATIVE mg/dL
Protein, ur: NEGATIVE mg/dL
Protein, ur: NEGATIVE mg/dL
Protein, ur: NEGATIVE mg/dL
Specific Gravity, Urine: 1.015 (ref 1.005–1.030)
Specific Gravity, Urine: 1.01 (ref 1.005–1.030)
Urobilinogen, UA: 1 mg/dL (ref 0.0–1.0)
Urobilinogen, UA: 0.2 mg/dL (ref 0.0–1.0)
Urobilinogen, UA: 0.2 mg/dL (ref 0.0–1.0)
Urobilinogen, UA: 0.2 mg/dL (ref 0.0–1.0)
pH: 5 (ref 5.0–8.0)
pH: 5.5 (ref 5.0–8.0)
pH: 5 (ref 5.0–8.0)
pH: 5 (ref 5.0–8.0)
pH: 5 (ref 5.0–8.0)

## 2010-04-15 LAB — WET PREP, GENITAL
Clue Cells Wet Prep HPF POC: NONE SEEN
Yeast Wet Prep HPF POC: NONE SEEN

## 2010-04-15 LAB — URINALYSIS, ROUTINE W REFLEX MICROSCOPIC
Bilirubin Urine: NEGATIVE
Hgb urine dipstick: NEGATIVE
Ketones, ur: NEGATIVE mg/dL
Specific Gravity, Urine: 1.025 (ref 1.005–1.030)
Urobilinogen, UA: 1 mg/dL (ref 0.0–1.0)

## 2010-04-15 LAB — GC/CHLAMYDIA PROBE AMP, GENITAL: Chlamydia, DNA Probe: NEGATIVE

## 2010-04-16 LAB — CARBOXYHEMOGLOBIN
O2 Saturation: 74 %
Total hemoglobin: 8.8 g/dL — ABNORMAL LOW (ref 12.5–16.0)

## 2010-04-16 LAB — POCT I-STAT 3, VENOUS BLOOD GAS (G3P V): Acid-base deficit: 1 mmol/L (ref 0.0–2.0)

## 2010-05-02 LAB — HCG, SERUM, QUALITATIVE: Preg, Serum: NEGATIVE

## 2010-05-02 LAB — CBC
MCHC: 32.5 g/dL (ref 30.0–36.0)
MCV: 77.8 fL — ABNORMAL LOW (ref 78.0–100.0)
Platelets: 268 10*3/uL (ref 150–400)
RDW: 16.5 % — ABNORMAL HIGH (ref 11.5–15.5)

## 2010-05-07 LAB — POCT PREGNANCY, URINE: Preg Test, Ur: NEGATIVE

## 2010-05-07 LAB — CBC
MCHC: 32.5 g/dL (ref 30.0–36.0)
MCV: 80.5 fL (ref 78.0–100.0)
Platelets: 239 10*3/uL (ref 150–400)
RBC: 4.44 MIL/uL (ref 3.87–5.11)

## 2010-05-07 LAB — COMPREHENSIVE METABOLIC PANEL
AST: 16 U/L (ref 0–37)
CO2: 25 mEq/L (ref 19–32)
Calcium: 8.8 mg/dL (ref 8.4–10.5)
Creatinine, Ser: 0.51 mg/dL (ref 0.4–1.2)
GFR calc Af Amer: >60 mL/min (ref >60)
GFR calc non Af Amer: >60 mL/min (ref >60)

## 2010-05-07 LAB — POCT URINALYSIS DIP (DEVICE)
Bilirubin Urine: NEGATIVE
Glucose, UA: NEGATIVE mg/dL
Ketones, ur: NEGATIVE mg/dL

## 2010-05-07 LAB — URINALYSIS, ROUTINE W REFLEX MICROSCOPIC
Bilirubin Urine: NEGATIVE
Hgb urine dipstick: NEGATIVE
Protein, ur: NEGATIVE mg/dL
Urobilinogen, UA: 0.2 mg/dL (ref 0.0–1.0)

## 2010-05-07 LAB — URINE CULTURE: Colony Count: 65000

## 2010-05-07 LAB — DIFFERENTIAL
Eosinophils Relative: 2 % (ref 0–5)
Lymphocytes Relative: 25 % (ref 12–46)
Lymphs Abs: 2.6 10*3/uL (ref 0.7–4.0)
Neutro Abs: 6.9 10*3/uL (ref 1.7–7.7)

## 2010-05-07 LAB — LIPASE, BLOOD: Lipase: 12 U/L (ref 11–59)

## 2010-06-14 NOTE — H&P (Signed)
[name], Calderon NO.:  [id number]   MEDICAL RECORD NO.:  [id number]          PATIENT TYPE:  OUT   LOCATION:  ULT                           [facility]   PHYSICIAN:  Ginger Carne, MD  DATE OF BIRTH:  Aug 14, 1975   DATE OF ADMISSION:  05/13/2005  DATE OF DISCHARGE:                                HISTORY & PHYSICAL   REASON FOR HOSPITALIZATION:  Abnormal second trimester ultrasound.   HISTORY OF PRESENT ILLNESS:  This patient is a 35 year old gravida 29, para 70-  0-1-5 African-American female with an estimated date of confinement of  October 25, 2005 at 15-6/[redacted] weeks gestation with a fetus demonstrating an  omphalocele with right renal hydronephrosis, lack of normal cardiac chambers  and outflow tract, spatial anomalies, and hydrocephalus.  The findings are  highly suggestive of trisomy 62 or 59.  The patient was seen by the family  practice center at [facility] and referred to the [facility]  of [facility] teaching service for further evaluation.  Dr. Gavin Potters has seen  said patient and an amniocentesis was performed for specific identified of a  set anomaly.  The patient desires termination of pregnancy.   OB/GYN HISTORY:  The patient has had 5 full-term vaginal deliveries and one  spontaneous first trimester abortion.   ALLERGIES:  None.   CURRENT MEDICATIONS:  Prenatal vitamins.   PAST MEDICAL HISTORY:  In 1997 the patient was treated for Chlamydia.  She  is sickle cell trait positive and a questionable history of chronic  hypertension.   SURGICAL HISTORY:  None.   FAMILY HISTORY:  Noncontributory for specific genetic abnormalities.   REVIEW OF SYSTEMS:  The patient is a nonsmoker.  She denies alcohol or drug  abuse.   REVIEW OF SYSTEMS:  Comprehensive 10-point review of systems unremarkable.   PHYSICAL EXAMINATION:  GENERAL:  A pleasant female in no acute distress.  VITAL SIGNS:  Per record.  HEENT:  Grossly normal.  CHEST:  Clear to percussion and auscultation.  CARDIAC EXAM:  Regular rate and rhythm, S1-S2 within normal limits.  Without  murmurs.  BREASTS:  Without masses, discharge, thickenings, or tenderness.  EXTREMITIES, LYMPHATICS, SKIN, NEUROLOGIC, MUSCULOSKELETAL SYSTEMS:  All  normal.  ABDOMEN:  Soft demonstrating a 16-week intrauterine pregnancy consistent  with ultrasound dates.  PELVIC EXAM:  Normal Pap smear.  External genitalia, vulva, and vagina  normal. Uterus 16 weeks in size.  Both adnexa palpable and thought to be  normal.   SPECIFIC LABORATORY VALUES:  The patient is A positive, rubella immune,  hepatitis B surface antigen negative.   IMPRESSION:  A 15-6/7 week fetus with multiple anomalies, scheduled for an  elective termination of pregnancy by dilation extraction.  The nature of  said procedure discussed, in detail, with the patient including risks and  benefits.      Ginger Carne, MD  Electronically Signed     SHB/MEDQ  D:  05/14/2005  T:  05/14/2005  Job:  [id number]

## 2010-06-14 NOTE — Op Note (Signed)
NAMESUBRINA, VECCHIARELLI NO.:  [id number]   MEDICAL RECORD NO.:  [id number]          PATIENT TYPE:  AMB   LOCATION:  SDC                           [facility]   PHYSICIAN:  Ginger Carne, MD  DATE OF BIRTH:  Aug 24, 1975   DATE OF PROCEDURE:  05/15/2005  DATE OF DISCHARGE:                                 OPERATIVE REPORT   PREOPERATIVE DIAGNOSIS:  Trisomy 55,  15-6/7-weeks' gestation with multiple  fetal anomalies.   POSTOPERATIVE DIAGNOSIS:  Trisomy 71,  15-6/7-weeks' gestation with multiple  fetal anomalies.   PROCEDURE:  Second trimester suction, dilation and extraction.   SURGEON:  Blima Rich, M.D.   ASSISTANT:  None.   COMPLICATIONS:  None immediate.   ESTIMATED BLOOD LOSS:  Minimal.   ANESTHESIA:  Spinal with local Marcaine with epinephrine paracervical block.   SPECIMEN:  Products of conception.   OPERATIVE FINDINGS:  Uterus was compatible to a 16-week size uterus.  External genitalia, vulva and vagina normal. Cervix smooth without erosions  or lesions.   OPERATIVE PROCEDURE:  The patient prepped and draped in the usual fashion  and placed in lithotomy position. Betadine solution used for antiseptic, and  the patient was catheterized prior to procedure. After adequate spinal  analgesia and a paracervical block, a tenaculum was placed on the anterior  lip of the cervix. Dilation to accommodate a #16 suction curette was  followed by suction and sharp curettage. At the end of the procedure,  minimal bleeding noted. All products of conception removed, and the uterus  was carefully inspected with a banjo curette. The patient tolerated the  procedure well and returned to the post-anesthesia recovery room in  excellent condition.      Ginger Carne, MD  Electronically Signed     SHB/MEDQ  D:  05/15/2005  T:  05/16/2005  Job:  [id number]

## 2010-06-20 ENCOUNTER — Emergency Department (HOSPITAL_COMMUNITY)
Admission: EM | Admit: 2010-06-20 | Discharge: 2010-06-20 | Disposition: A | Discharge status: Home / Self Care | Payer: Medicaid Other | Attending: Emergency Medicine | Admitting: Emergency Medicine

## 2010-06-20 DIAGNOSIS — X58XXXA Exposure to other specified factors, initial encounter: Secondary | ICD-10-CM | POA: Insufficient documentation

## 2010-06-20 DIAGNOSIS — M545 Low back pain, unspecified: Secondary | ICD-10-CM | POA: Insufficient documentation

## 2010-06-20 DIAGNOSIS — D573 Sickle-cell trait: Secondary | ICD-10-CM | POA: Insufficient documentation

## 2010-06-20 DIAGNOSIS — S335XXA Sprain of ligaments of lumbar spine, initial encounter: Secondary | ICD-10-CM | POA: Insufficient documentation

## 2010-06-20 LAB — POCT PREGNANCY, URINE: Preg Test, Ur: NEGATIVE

## 2010-06-20 LAB — URINALYSIS, ROUTINE W REFLEX MICROSCOPIC
Ketones, ur: NEGATIVE mg/dL
Nitrite: NEGATIVE
Protein, ur: NEGATIVE mg/dL
Urobilinogen, UA: 0.2 mg/dL (ref 0.0–1.0)

## 2010-07-17 ENCOUNTER — Encounter: Payer: Self-pay | Admitting: Family Medicine

## 2010-08-06 ENCOUNTER — Encounter: Payer: Self-pay | Admitting: Family Medicine

## 2010-10-24 LAB — URINALYSIS, ROUTINE W REFLEX MICROSCOPIC
Glucose, UA: NEGATIVE
Ketones, ur: NEGATIVE
pH: 5.5

## 2010-11-01 LAB — URINALYSIS, ROUTINE W REFLEX MICROSCOPIC
Nitrite: NEGATIVE
Specific Gravity, Urine: 1.015
pH: 7.5

## 2010-11-01 LAB — POCT PREGNANCY, URINE
Operator id: 22333
Preg Test, Ur: POSITIVE

## 2010-11-01 LAB — GC/CHLAMYDIA PROBE AMP, GENITAL: GC Probe Amp, Genital: NEGATIVE

## 2011-02-12 ENCOUNTER — Emergency Department (HOSPITAL_COMMUNITY)
Admission: EM | Admit: 2011-02-12 | Discharge: 2011-02-12 | Disposition: A | Discharge status: Home / Self Care | Payer: Medicaid Other | Attending: Emergency Medicine | Admitting: Emergency Medicine

## 2011-02-12 ENCOUNTER — Encounter (HOSPITAL_COMMUNITY): Payer: Self-pay | Admitting: Emergency Medicine

## 2011-02-12 ENCOUNTER — Emergency Department (HOSPITAL_COMMUNITY): Payer: Medicaid Other

## 2011-02-12 DIAGNOSIS — R059 Cough, unspecified: Secondary | ICD-10-CM | POA: Insufficient documentation

## 2011-02-12 DIAGNOSIS — J40 Bronchitis, not specified as acute or chronic: Secondary | ICD-10-CM | POA: Insufficient documentation

## 2011-02-12 DIAGNOSIS — M549 Dorsalgia, unspecified: Secondary | ICD-10-CM | POA: Insufficient documentation

## 2011-02-12 DIAGNOSIS — R05 Cough: Secondary | ICD-10-CM | POA: Insufficient documentation

## 2011-02-12 LAB — URINALYSIS, ROUTINE W REFLEX MICROSCOPIC
Glucose, UA: NEGATIVE mg/dL
Hgb urine dipstick: NEGATIVE
Leukocytes, UA: NEGATIVE
Specific Gravity, Urine: 1.021 (ref 1.005–1.030)

## 2011-02-12 LAB — POCT PREGNANCY, URINE: Preg Test, Ur: NEGATIVE

## 2011-02-12 MED ORDER — BENZONATATE 100 MG PO CAPS: 100.0000 mg | ORAL_CAPSULE | Freq: Three times a day (TID) | ORAL | Status: AC

## 2011-02-12 NOTE — ED Notes (Signed)
Pt st's she has had a cough for approx 3 weeks.  Also c/o pain in mid back and bil lower abd pain.  Pt st's cough is nonproductive

## 2011-02-12 NOTE — ED Provider Notes (Signed)
History     CSN: [id number]  Arrival date & time 02/12/11  1528   First MD Initiated Contact with Patient 02/12/11 1707      Chief Complaint  Patient presents with  . Cough    (Consider location/radiation/quality/duration/timing/severity/associated sxs/prior treatment) Patient is a 36 y.o. female presenting with cough. The history is provided by the patient.  Cough Pertinent negatives include no chest pain, no chills, no sore throat and no shortness of breath.   the patient is a 36 year old female, with no significant past medical history, except for sickle cell trait, who presents to emergency department complaining of a nonproductive cough, and back pain.  For approximately 2 weeks.  She denies fevers, chills, nausea, vomiting, or sweating.  She does not smoke.  She denies as.  History reviewed. No pertinent past medical history.  Past Surgical History  Procedure Date  . Cholecystectomy     No family history on file.  History  Substance Use Topics  . Smoking status: Former Games developer  . Smokeless tobacco: Not on file  . Alcohol Use: No    OB History    Grav Para Term Preterm Abortions TAB SAB Ect Mult Living                  Review of Systems  Constitutional: Negative for fever and chills.  HENT: Negative for congestion and sore throat.   Respiratory: Positive for cough. Negative for shortness of breath.   Cardiovascular: Negative for chest pain.  Gastrointestinal: Negative for nausea and vomiting.  Musculoskeletal: Positive for back pain.  All other systems reviewed and are negative.    Allergies  Penicillins  Home Medications  No current outpatient prescriptions on file.  BP 116/66  Pulse 100  Temp(Src) 98.4 F (36.9 C) (Oral)  Resp 20  SpO2 98%  LMP 02/05/2011  Physical Exam  Constitutional: She is oriented to person, place, and time. She appears well-developed and well-nourished.  HENT:  Head: Normocephalic and atraumatic.  Eyes: Pupils are  equal, round, and reactive to light.  Neck: Normal range of motion.  Cardiovascular: Normal rate, regular rhythm and normal heart sounds.   No murmur heard. Pulmonary/Chest: Effort normal and breath sounds normal. No respiratory distress. She has no wheezes. She has no rales.  Abdominal: Soft.  Musculoskeletal: Normal range of motion. She exhibits no edema and no tenderness.  Neurological: She is alert and oriented to person, place, and time. No cranial nerve deficit.  Skin: Skin is warm and dry. No rash noted. No erythema.  Psychiatric: She has a normal mood and affect. Her behavior is normal.    ED Course  Procedures (including critical care time) 22 showed, female, with nonproductive cough for several weeks.  Physical examination is normal.  We will perform a chest x-ray to see if she has pneumonia.  She, does.  We'll place her on antibiotics.  Otherwise, we will just treat her symptomatically.   Labs Reviewed  URINALYSIS, ROUTINE W REFLEX MICROSCOPIC  POCT PREGNANCY, URINE  POCT PREGNANCY, URINE   No results found.   No diagnosis found.    MDM  Bronchitis No pneumonia.  No respiratory distress.  No hypoxia.  No back injury.        Nicholes Stairs, MD 02/12/11 1739

## 2011-03-10 ENCOUNTER — Encounter: Payer: Medicaid Other | Admitting: Family Medicine

## 2011-09-10 ENCOUNTER — Ambulatory Visit (INDEPENDENT_AMBULATORY_CARE_PROVIDER_SITE_OTHER): Payer: Medicaid Other | Admitting: Family Medicine

## 2011-09-10 ENCOUNTER — Encounter: Payer: Self-pay | Admitting: Family Medicine

## 2011-09-10 VITALS — BP 113/76 | HR 66 | Temp 98.4°F | Ht 63.25 in | Wt 218.0 lb

## 2011-09-10 DIAGNOSIS — J029 Acute pharyngitis, unspecified: Secondary | ICD-10-CM

## 2011-09-10 DIAGNOSIS — E669 Obesity, unspecified: Secondary | ICD-10-CM

## 2011-09-10 DIAGNOSIS — J309 Allergic rhinitis, unspecified: Secondary | ICD-10-CM | POA: Insufficient documentation

## 2011-09-10 LAB — COMPREHENSIVE METABOLIC PANEL
ALT: 8 U/L (ref 0–35)
AST: 11 U/L (ref 0–37)
Albumin: 3.9 g/dL (ref 3.5–5.2)
Alkaline Phosphatase: 43 U/L (ref 39–117)
BUN: 14 mg/dL (ref 6–23)
CO2: 27 mEq/L (ref 19–32)
Calcium: 9.2 mg/dL (ref 8.4–10.5)
Chloride: 108 mEq/L (ref 96–112)
Creat: 0.5 mg/dL (ref 0.50–1.10)
Glucose, Bld: 88 mg/dL (ref 70–99)
Potassium: 4.1 mEq/L (ref 3.5–5.3)
Sodium: 137 mEq/L (ref 135–145)
Total Bilirubin: 0.3 mg/dL (ref 0.3–1.2)
Total Protein: 6.9 g/dL (ref 6.0–8.3)

## 2011-09-10 LAB — LIPID PANEL
Cholesterol: 154 mg/dL (ref 0–200)
HDL: 42 mg/dL (ref >39)
LDL Cholesterol: 95 mg/dL (ref 0–99)
Total CHOL/HDL Ratio: 3.7 Ratio
Triglycerides: 84 mg/dL (ref <150)
VLDL: 17 mg/dL (ref 0–40)

## 2011-09-10 LAB — POCT RAPID STREP A (OFFICE): Rapid Strep A Screen: NEGATIVE

## 2011-09-10 MED ORDER — LORATADINE 10 MG PO TABS: 10.0000 mg | ORAL_TABLET | Freq: Every day | ORAL | Status: DC

## 2011-09-10 NOTE — Assessment & Plan Note (Signed)
A: patietn concerned about weight/risk of developing DM and HTN and desires to lose weight. Declines nutrition visit at this time.  P:  1. Exercise 30-45 minutes daily: walk and lift light weights.  2.  For your diet: keep it low in carb and high in lean protein (fish, lean beef) and vegetables (with every lunch and dinner).  1. Make sure to eat breakfast, lunch and dinner (also may add one snack mid morning or mid afternoon).  2. Carbs: no more than 2 servings (30 gram/2oz) per meal and 1 serving per snack.  3. Water, water, water Check LDL (fasting) and CMP (fasting blood sugar) F/u in 3 months or sooner if needed

## 2011-09-10 NOTE — Assessment & Plan Note (Signed)
A: suspect allergic cause of sore throat especially given rhinitis on exam.  PL Claritin.

## 2011-09-10 NOTE — Patient Instructions (Addendum)
Magali,  Thank you for coming in today.  For weight loss:  1. Exercise 30-45 minutes daily: walk and lift light weights.  2.  For your diet: keep it low in carb and high in lean protein (fish, lean beef) and vegetables (with every lunch and dinner).  1. Make sure to eat breakfast, lunch and dinner (also may add one snack mid morning or mid afternoon).  2. Carbs: no more than 2 servings (30 gram/2oz) per meal and 1 serving per snack.  3. Water, water, water  For sore throat: rapid strep test is negative  1. claritin daily  2. Gargle with warm salt water if soreness worsens.   I am checking your blood sugar and cholesterol today. I will if further labs or treatments are needed. I will send a letter if all normal.  Next pap due: 09/20/12.   F/u in 3 months for weight loss/back pain f/u.   Dr. Armen Pickup

## 2011-09-10 NOTE — Progress Notes (Signed)
Subjective:     Patient ID: Kathryn Calderon, female   DOB: 30-Jul-1975, 36 y.o.   MRN: [id number]  HPI 36 yo F present for physical and to discuss the following:  1. Back pain: x 1 year. Mid back, previously low back. Non-radiating. No fall or injury. Admits to gaining weight. Denies dysuria.   2. Sore throat: x 2 weeks. Mild. No medications taken. Daughter with strep throat 3 weeks ago. No fever. Mild intermittent cough.   3. Obesity: chronic. Exercises sporadically. Walks. Does not diet. Does not work. Does not weight lift. Drinks juices, skips breakfast and lunch. Denies knee pain.   History reviewed. No pertinent past medical history. Past Surgical History  Procedure Date  . Cholecystectomy 2000   Family History  Problem Relation Age of Onset  . Diabetes Maternal Grandmother   . Hypertension Maternal Grandfather   . Birth defects Paternal Grandmother 62    Gallbladder    History   Social History  . Marital Status: Single    Spouse Name: N/A    Number of Children: 3  . Years of Education: 12   Occupational History  . Unemployed     Social History Main Topics  . Smoking status: Former Smoker -- 0.2 packs/day for 5 years    Types: Cigarettes    Quit date: 09/10/2006  . Smokeless tobacco: Never Used  . Alcohol Use: Yes     occasional   . Drug Use: No  . Sexually Active: Not Currently    Birth Control/ Protection: Condom   Other Topics Concern  . Not on file   Social History Narrative   Lives at home with 3 children.Close partner.    Review of Systems Review of Systems - Negative except trouble swallowing and cough.     Objective:   Physical Exam BP 113/76  Pulse 66  Temp 98.4 F (36.9 C) (Oral)  Ht 5' 3.25" (1.607 m)  Wt 218 lb (98.884 kg)  BMI 38.31 kg/m2  LMP 09/10/2011 General appearance: alert, cooperative, no distress and moderately obese Head: Normocephalic, without obvious abnormality, atraumatic Eyes: conjunctivae/corneas clear. PERRL, EOM's  intact.  Ears: normal TM's and external ear canals both ears Nose: no discharge, turbinates pink, swollen, no sinus tenderness, no crusting or bleeding points Throat: lips, mucosa, and tongue normal; teeth and gums normal Neck: no adenopathy, no carotid bruit, no JVD, supple, symmetrical, trachea midline and thyroid not enlarged, symmetric, no tenderness/mass/nodules Back: symmetric, no curvature. ROM normal. No CVA tenderness. Lungs: clear to auscultation bilaterally Heart: regular rate and rhythm, S1, S2 normal, no murmur, click, rub or gallop Abdomen: obese, soft, non-tender; bowel sounds normal; no masses,  no organomegaly Extremities: extremities normal, atraumatic, no cyanosis or edema  Rapid strep test: negative  Pulses: 2+ and symmetric Skin: Skin color, texture, turgor normal. No rashes or lesions Neurologic: Grossly normal    Assessment and Plan:

## 2011-10-20 ENCOUNTER — Encounter (HOSPITAL_COMMUNITY): Payer: Self-pay | Admitting: *Deleted

## 2011-10-20 ENCOUNTER — Emergency Department (HOSPITAL_COMMUNITY)
Admission: EM | Admit: 2011-10-20 | Discharge: 2011-10-21 | Disposition: A | Discharge status: Home / Self Care | Payer: Medicaid Other | Attending: Emergency Medicine | Admitting: Emergency Medicine

## 2011-10-20 DIAGNOSIS — J069 Acute upper respiratory infection, unspecified: Secondary | ICD-10-CM | POA: Insufficient documentation

## 2011-10-20 DIAGNOSIS — Z3201 Encounter for pregnancy test, result positive: Secondary | ICD-10-CM | POA: Insufficient documentation

## 2011-10-20 DIAGNOSIS — Z3490 Encounter for supervision of normal pregnancy, unspecified, unspecified trimester: Secondary | ICD-10-CM

## 2011-10-20 DIAGNOSIS — E876 Hypokalemia: Secondary | ICD-10-CM | POA: Insufficient documentation

## 2011-10-20 DIAGNOSIS — R112 Nausea with vomiting, unspecified: Secondary | ICD-10-CM | POA: Insufficient documentation

## 2011-10-20 DIAGNOSIS — Z87891 Personal history of nicotine dependence: Secondary | ICD-10-CM | POA: Insufficient documentation

## 2011-10-20 LAB — URINALYSIS, ROUTINE W REFLEX MICROSCOPIC
Bilirubin Urine: NEGATIVE
Glucose, UA: NEGATIVE mg/dL
Hgb urine dipstick: NEGATIVE
Ketones, ur: NEGATIVE mg/dL
Leukocytes, UA: NEGATIVE
Nitrite: NEGATIVE
Protein, ur: NEGATIVE mg/dL
Specific Gravity, Urine: 1.013 (ref 1.005–1.030)
Urobilinogen, UA: 0.2 mg/dL (ref 0.0–1.0)
pH: 6 (ref 5.0–8.0)

## 2011-10-20 LAB — CBC WITH DIFFERENTIAL/PLATELET
Basophils Absolute: 0 10*3/uL (ref 0.0–0.1)
Basophils Relative: 0 % (ref 0–1)
Eosinophils Absolute: 0.5 10*3/uL (ref 0.0–0.7)
Eosinophils Relative: 3 % (ref 0–5)
HCT: 29 % — ABNORMAL LOW (ref 36.0–46.0)
Hemoglobin: 9.2 g/dL — ABNORMAL LOW (ref 12.0–15.0)
Lymphocytes Relative: 13 % (ref 12–46)
Lymphs Abs: 2 10*3/uL (ref 0.7–4.0)
MCH: 21.5 pg — ABNORMAL LOW (ref 26.0–34.0)
MCHC: 31.7 g/dL (ref 30.0–36.0)
MCV: 67.9 fL — ABNORMAL LOW (ref 78.0–100.0)
Monocytes Absolute: 0.9 10*3/uL (ref 0.1–1.0)
Monocytes Relative: 6 % (ref 3–12)
Neutro Abs: 12 10*3/uL — ABNORMAL HIGH (ref 1.7–7.7)
Neutrophils Relative %: 78 % — ABNORMAL HIGH (ref 43–77)
Platelets: 394 10*3/uL (ref 150–400)
RBC: 4.27 MIL/uL (ref 3.87–5.11)
RDW: 20.1 % — ABNORMAL HIGH (ref 11.5–15.5)
WBC: 15.4 10*3/uL — ABNORMAL HIGH (ref 4.0–10.5)

## 2011-10-20 LAB — COMPREHENSIVE METABOLIC PANEL WITH GFR
ALT: 14 U/L (ref 0–35)
AST: 13 U/L (ref 0–37)
Albumin: 3.4 g/dL — ABNORMAL LOW (ref 3.5–5.2)
Alkaline Phosphatase: 52 U/L (ref 39–117)
BUN: 7 mg/dL (ref 6–23)
CO2: 23 meq/L (ref 19–32)
Calcium: 9.7 mg/dL (ref 8.4–10.5)
Chloride: 102 meq/L (ref 96–112)
Creatinine, Ser: 0.49 mg/dL — ABNORMAL LOW (ref 0.50–1.10)
GFR calc Af Amer: >90 mL/min
GFR calc non Af Amer: >90 mL/min
Glucose, Bld: 98 mg/dL (ref 70–99)
Potassium: 3.3 meq/L — ABNORMAL LOW (ref 3.5–5.1)
Sodium: 136 meq/L (ref 135–145)
Total Bilirubin: 0.3 mg/dL (ref 0.3–1.2)
Total Protein: 7.8 g/dL (ref 6.0–8.3)

## 2011-10-20 LAB — PREGNANCY, URINE: Preg Test, Ur: POSITIVE — AB

## 2011-10-20 NOTE — ED Notes (Signed)
The pt has had a cold fever abd pain lower back pain headache nv and diarrhea for 2 weeks.  lmp last month

## 2011-10-21 ENCOUNTER — Telehealth: Payer: Self-pay | Admitting: Family Medicine

## 2011-10-21 MED ORDER — ONDANSETRON 4 MG PO TBDP
8.0000 mg | ORAL_TABLET | Freq: Once | ORAL | Status: AC
  Administered 2011-10-21: 8 mg via ORAL
  Filled 2011-10-21: qty 2

## 2011-10-21 MED ORDER — ACETAMINOPHEN 325 MG PO TABS
650.0000 mg | ORAL_TABLET | Freq: Once | ORAL | Status: AC
  Administered 2011-10-21: 650 mg via ORAL
  Filled 2011-10-21: qty 2

## 2011-10-21 MED ORDER — POTASSIUM CHLORIDE CRYS ER 20 MEQ PO TBCR
20.0000 meq | EXTENDED_RELEASE_TABLET | Freq: Once | ORAL | Status: AC
  Administered 2011-10-21: 20 meq via ORAL
  Filled 2011-10-21: qty 1

## 2011-10-21 MED ORDER — PRENATAL COMPLETE 14-0.4 MG PO TABS: 1.0000 | ORAL_TABLET | ORAL | Status: DC

## 2011-10-21 MED ORDER — ONDANSETRON HCL 4 MG PO TABS: 4.0000 mg | ORAL_TABLET | Freq: Four times a day (QID) | ORAL | Status: DC

## 2011-10-21 NOTE — ED Notes (Signed)
PO fluids provided. Pt tolerating well.

## 2011-10-21 NOTE — Telephone Encounter (Signed)
Call from Dr. Dierdre Highman, patient in ED with URI symptoms, elevated WBC, afebrile currently but report Tmax of 101 today. Incidentally pregnant. LMP ~ 1 mos ago. In need of close f/u. Advised that patient  to call [phone] for office visit. This can be ED f/u/ visit for new OB labs.   Dr. Dierdre Highman agreed and will pass message on to patient. 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 She can see another team member.

## 2011-10-21 NOTE — ED Provider Notes (Signed)
History     CSN: [id number]  Arrival date & time 10/20/11  2016   First MD Initiated Contact with Patient 10/21/11 0102      Chief Complaint  Patient presents with  . multiple complaints     (Consider location/radiation/quality/duration/timing/severity/associated sxs/prior treatment) HPI HX per PT, feeling sick the last 3 weeks, cough, congestion and runny nose with N/V and back pain, LMP last month some time. G? (cant rememeber) P9. Son and daughter are sick at home with "a cold". No ABD pain. Felt feverish yesterday, taking tylenol sinus and nyquil with some relief.  Symptoms mild to mod in severity. No vag discharge or bleeding, no pelvic pain.   History reviewed. No pertinent past medical history.  Past Surgical History  Procedure Date  . Cholecystectomy 2000    Family History  Problem Relation Age of Onset  . Diabetes Maternal Grandmother   . Hypertension Maternal Grandfather   . Birth defects Paternal Grandmother 11    Gallbladder     History  Substance Use Topics  . Smoking status: Former Smoker -- 0.2 packs/day for 5 years    Types: Cigarettes    Quit date: 09/10/2006  . Smokeless tobacco: Never Used  . Alcohol Use: Yes     occasional     OB History    Grav Para Term Preterm Abortions TAB SAB Ect Mult Living                  Review of Systems  Constitutional: Positive for fever.  HENT: Positive for congestion. Negative for neck pain and neck stiffness.   Eyes: Negative for pain.  Respiratory: Positive for cough. Negative for shortness of breath.   Cardiovascular: Negative for chest pain.  Gastrointestinal: Negative for abdominal pain.  Genitourinary: Negative for dysuria, hematuria and pelvic pain.  Musculoskeletal: Negative for joint swelling.  Skin: Negative for rash.  Neurological: Negative for headaches.  All other systems reviewed and are negative.    Allergies  Penicillins  Home Medications   Current Outpatient Rx  Name Route Sig  Dispense Refill  . PSEUDOEPH-DOXYLAMINE-DM-APAP 60-7.06-25-998 MG/30ML PO LIQD Oral Take 30 mLs by mouth daily as needed. For allergy    . PSEUDOEPHEDRINE-ACETAMINOPHEN 30-500 MG PO TABS Oral Take 1 tablet by mouth every 4 (four) hours as needed. For allergy      BP 118/71  Pulse 90  Temp 98.5 F (36.9 C) (Oral)  Resp 22  SpO2 99%  LMP 10/20/2011  Physical Exam  Constitutional: She is oriented to person, place, and time. She appears well-developed and well-nourished.  HENT:  Head: Normocephalic and atraumatic.       Nasal congestion  Eyes: Conjunctivae normal and EOM are normal. Pupils are equal, round, and reactive to light.  Neck: Trachea normal. Neck supple. No thyromegaly present.  Cardiovascular: Normal rate, regular rhythm, S1 normal, S2 normal and normal pulses.     No systolic murmur is present   No diastolic murmur is present  Pulses:      Radial pulses are 2+ on the right side, and 2+ on the left side.  Pulmonary/Chest: Effort normal and breath sounds normal. She has no wheezes. She has no rhonchi. She has no rales. She exhibits no tenderness.  Abdominal: Soft. Normal appearance and bowel sounds are normal. There is no tenderness. There is no CVA tenderness and negative Murphy's sign.       Obese, fundus not palpated  Musculoskeletal:       BLE:s Calves nontender,  no cords or erythema, negative Homans sign  Neurological: She is alert and oriented to person, place, and time. She has normal strength. No cranial nerve deficit or sensory deficit. GCS eye subscore is 4. GCS verbal subscore is 5. GCS motor subscore is 6.  Skin: Skin is warm and dry. No rash noted. She is not diaphoretic.  Psychiatric: Her speech is normal.       Cooperative and appropriate    ED Course  Procedures (including critical care time)  Results for orders placed during the hospital encounter of 10/20/11  URINALYSIS, ROUTINE W REFLEX MICROSCOPIC      Component Value Range   Color, Urine YELLOW   YELLOW   APPearance HAZY (*) CLEAR   Specific Gravity, Urine 1.013  1.005 - 1.030   pH 6.0  5.0 - 8.0   Glucose, UA NEGATIVE  NEGATIVE mg/dL   Hgb urine dipstick NEGATIVE  NEGATIVE   Bilirubin Urine NEGATIVE  NEGATIVE   Ketones, ur NEGATIVE  NEGATIVE mg/dL   Protein, ur NEGATIVE  NEGATIVE mg/dL   Urobilinogen, UA 0.2  0.0 - 1.0 mg/dL   Nitrite NEGATIVE  NEGATIVE   Leukocytes, UA NEGATIVE  NEGATIVE  PREGNANCY, URINE      Component Value Range   Preg Test, Ur POSITIVE (*) NEGATIVE  CBC WITH DIFFERENTIAL      Component Value Range   WBC 15.4 (*) 4.0 - 10.5 K/uL   RBC 4.27  3.87 - 5.11 MIL/uL   Hemoglobin 9.2 (*) 12.0 - 15.0 g/dL   HCT 86.5 (*) 78.4 - 69.6 %   MCV 67.9 (*) 78.0 - 100.0 fL   MCH 21.5 (*) 26.0 - 34.0 pg   MCHC 31.7  30.0 - 36.0 g/dL   RDW 29.5 (*) 28.4 - 13.2 %   Platelets 394  150 - 400 K/uL   Neutrophils Relative 78 (*) 43 - 77 %   Lymphocytes Relative 13  12 - 46 %   Monocytes Relative 6  3 - 12 %   Eosinophils Relative 3  0 - 5 %   Basophils Relative 0  0 - 1 %   Neutro Abs 12.0 (*) 1.7 - 7.7 K/uL   Lymphs Abs 2.0  0.7 - 4.0 K/uL   Monocytes Absolute 0.9  0.1 - 1.0 K/uL   Eosinophils Absolute 0.5  0.0 - 0.7 K/uL   Basophils Absolute 0.0  0.0 - 0.1 K/uL   RBC Morphology POLYCHROMASIA PRESENT    COMPREHENSIVE METABOLIC PANEL      Component Value Range   Sodium 136  135 - 145 mEq/L   Potassium 3.3 (*) 3.5 - 5.1 mEq/L   Chloride 102  96 - 112 mEq/L   CO2 23  19 - 32 mEq/L   Glucose, Bld 98  70 - 99 mg/dL   BUN 7  6 - 23 mg/dL   Creatinine, Ser 4.40 (*) 0.50 - 1.10 mg/dL   Calcium 9.7  8.4 - 10.2 mg/dL   Total Protein 7.8  6.0 - 8.3 g/dL   Albumin 3.4 (*) 3.5 - 5.2 g/dL   AST 13  0 - 37 U/L   ALT 14  0 - 35 U/L   Alkaline Phosphatase 52  39 - 117 U/L   Total Bilirubin 0.3  0.3 - 1.2 mg/dL   GFR calc non Af Amer >90  >90 mL/min   GFR calc Af Amer >90  >90 mL/min  LIPASE, BLOOD      Component Value Range  Lipase 14  11 - 59 U/L   Clinical URI,  positive preg test but no symptoms to suggest need for emergent US/ ectopic.  zofran and tylenol provided. At 2am is tolerating POs NAD and no N/V. PT requesting to be discharged home.  repeat exam remains to ABD pain or tenderness.   2:04 AM d/w PCP, FP on call Dr Armen Pickup. Plan is follow up in clinic this week for recheck and return to ED for any worsening or concerning symptoms. DR Armen Pickup can see her this week.   Plan d/c home, URI precautions verbalized as understood, will Rx PNVs and PT agrees to all d/c and f/u instructions.  MDM  36 yo female with early pregnancy, N/V and URI symptoms.  Improved with zofran. Lungs CTA and pulse ox 99% RA without wheezing. Potassium provided for hypokalemia  VS and nursing notes reviewed. Labs and UA as above.         Sunnie Nielsen, MD 10/21/11 [id number]

## 2012-01-08 IMAGING — CR DG TIBIA/FIBULA 2V*L*
4 series · 4 of 4 positions shown · non-contrast
Comparison: 08/04/2006.

CLINICAL DATA: Lower left leg pain.  No known injury.

LEFT TIBIA AND FIBULA - 2 VIEW

[t tib/fib ap left (1 of 2)]
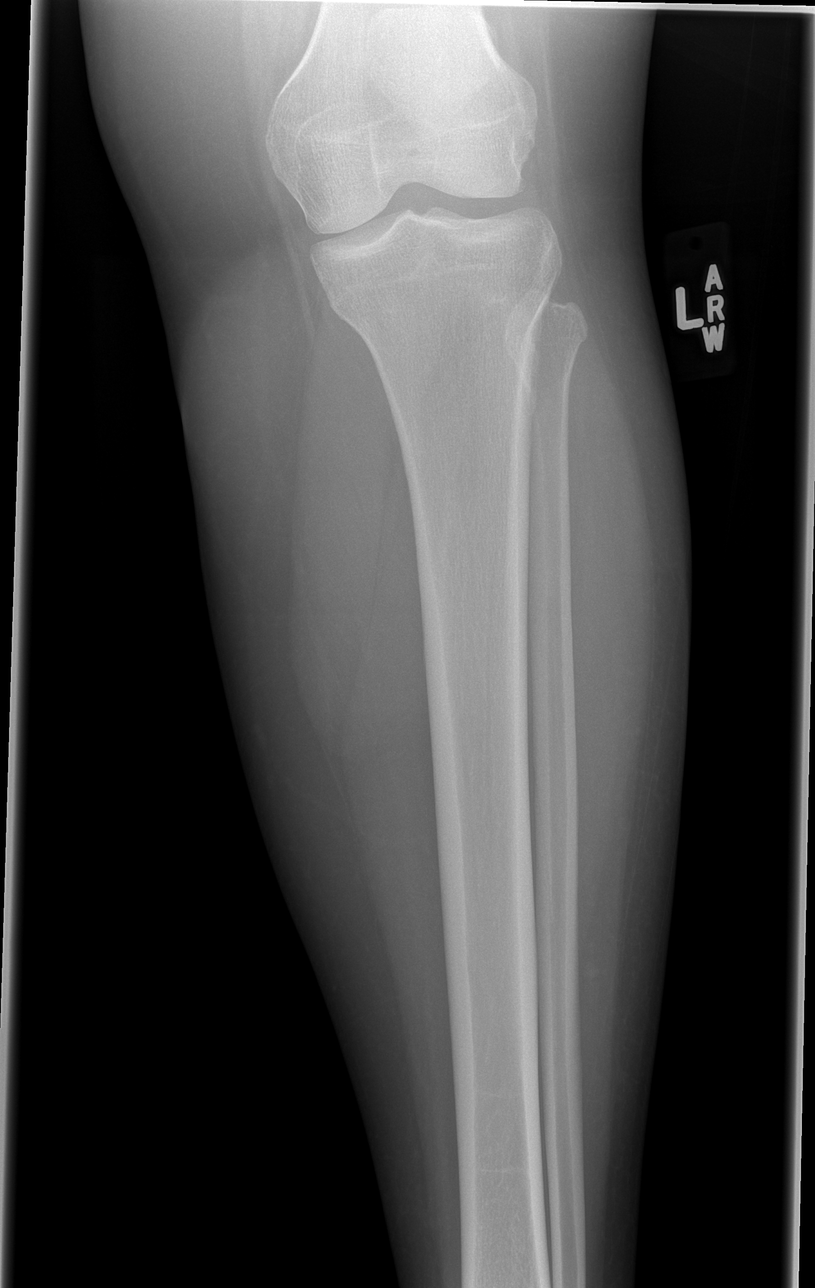

[t tib/fib ap left (2 of 2)]
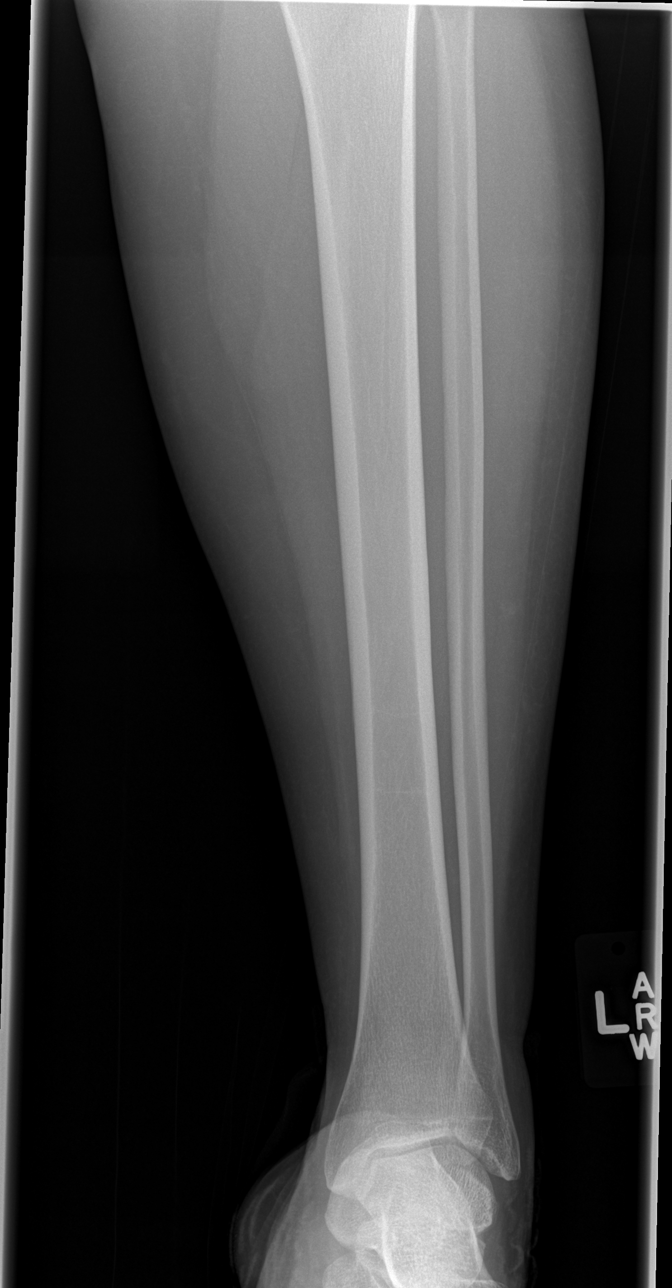

[t tib/fib lat left (1 of 2)]
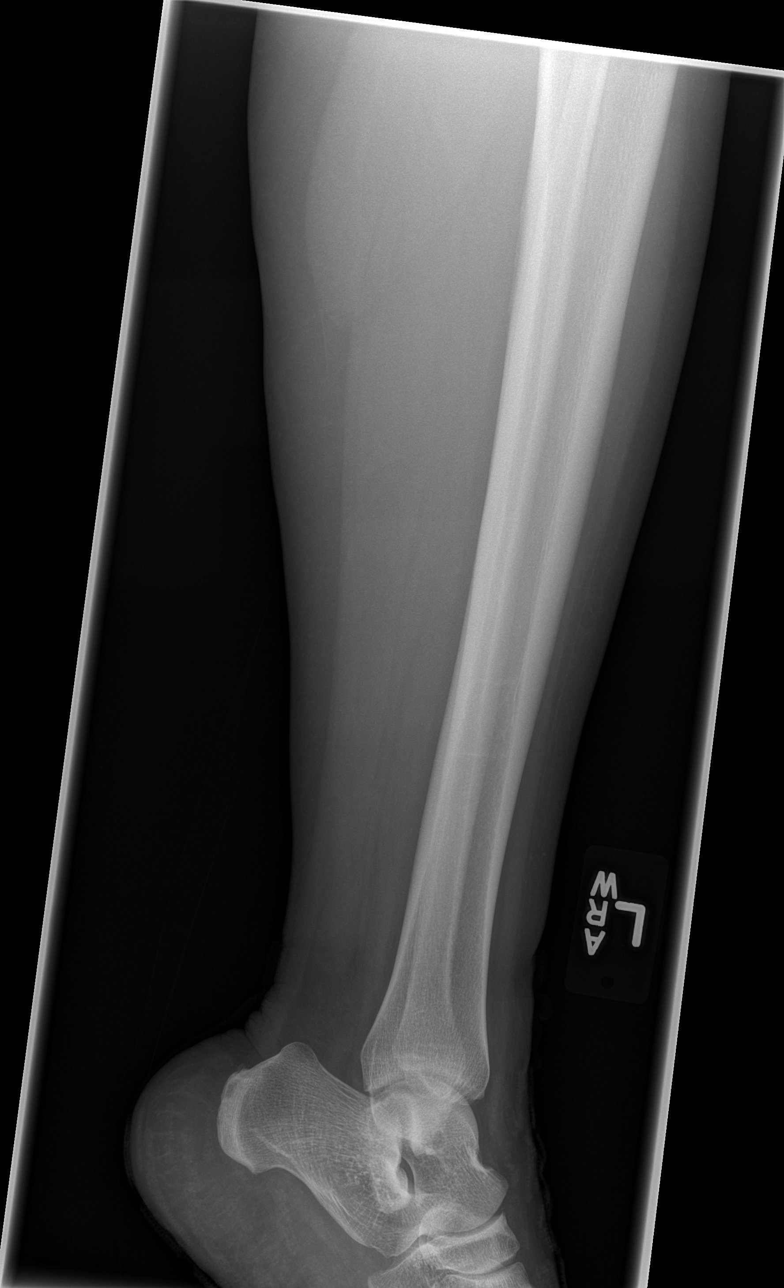

[t tib/fib lat left (2 of 2)]
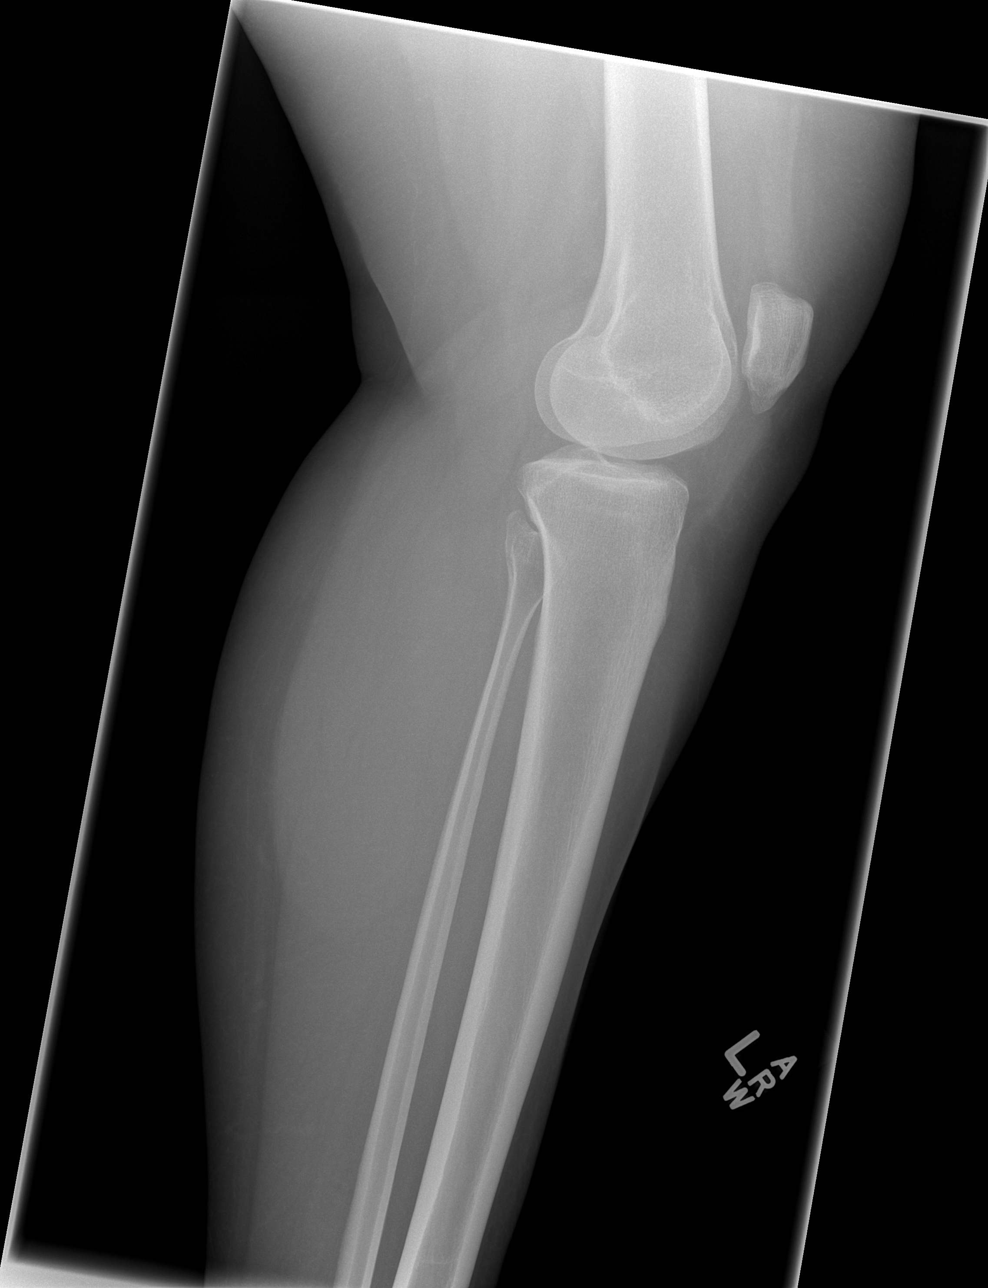

[4 of 4 positions shown; findings below may reference images not displayed]

FINDINGS: The mineralization and alignment are normal.  There is no
evidence of acute fracture or dislocation.  No soft tissue
abnormalities are identified.
IMPRESSION: Stable radiographs of the left lower leg.  No acute osseous
findings demonstrated.

## 2012-01-14 ENCOUNTER — Other Ambulatory Visit: Payer: Medicaid Other

## 2012-01-14 DIAGNOSIS — Z331 Pregnant state, incidental: Secondary | ICD-10-CM

## 2012-01-14 NOTE — Progress Notes (Signed)
PRENATAL LABS DONE TODAY Kathryn Calderon 

## 2012-01-15 LAB — OBSTETRIC PANEL
Eosinophils Absolute: 0.2 10*3/uL (ref 0.0–0.7)
Eosinophils Relative: 2 % (ref 0–5)
Hemoglobin: 8.7 g/dL — ABNORMAL LOW (ref 12.0–15.0)
Hepatitis B Surface Ag: NEGATIVE
Lymphocytes Relative: 17 % (ref 12–46)
Lymphs Abs: 2 10*3/uL (ref 0.7–4.0)
MCH: 20.6 pg — ABNORMAL LOW (ref 26.0–34.0)
MCV: 65.9 fL — ABNORMAL LOW (ref 78.0–100.0)
Monocytes Relative: 9 % (ref 3–12)
Neutrophils Relative %: 72 % (ref 43–77)
Platelets: 384 10*3/uL (ref 150–400)
RBC: 4.22 MIL/uL (ref 3.87–5.11)
Rh Type: POSITIVE
Rubella: 2.73 Index — ABNORMAL HIGH (ref <0.90)
WBC: 11.7 10*3/uL — ABNORMAL HIGH (ref 4.0–10.5)

## 2012-01-15 LAB — PRENATAL ANTIBODY IDENTIFICATION

## 2012-01-28 NOTE — L&D Delivery Note (Signed)
I was present for delivery and agree with note above. MUHAMMAD,Lajuanna Pompa  

## 2012-01-28 NOTE — L&D Delivery Note (Signed)
Delivery Note At 1:30 AM a viable female was delivered via Vaginal, Spontaneous Delivery (Presentation: Left Occiput Anterior).  APGAR: 8, 9; weight .   Placenta status: required manual removal. After waiting 35 minutes after delivery of the baby, placenta remained firmly attached to uterine wall. Dr. Marice Potter performed manual removal of the placenta. Some continued bleeding after placenta removed, so cytotec was given per rectum. Cord: 3 vessels with the following complications: None.  Cord pH: n/a  Anesthesia: Epidural  Episiotomy: None Lacerations: none Suture Repair: n/a Est. Blood Loss (mL): 500  Mom to postpartum.  Baby to nursery-stable.  Levert Feinstein 05/12/2012, 2:17 AM

## 2012-02-05 ENCOUNTER — Ambulatory Visit (INDEPENDENT_AMBULATORY_CARE_PROVIDER_SITE_OTHER): Payer: Medicaid Other | Admitting: Family Medicine

## 2012-02-05 ENCOUNTER — Other Ambulatory Visit (HOSPITAL_COMMUNITY)
Admission: RE | Admit: 2012-02-05 | Discharge: 2012-02-05 | Disposition: A | Discharge status: Home / Self Care | Payer: Medicaid Other | Source: Ambulatory Visit | Attending: Family Medicine | Admitting: Family Medicine

## 2012-02-05 ENCOUNTER — Encounter: Payer: Self-pay | Admitting: Family Medicine

## 2012-02-05 VITALS — BP 119/81 | Temp 98.6°F | Wt 218.0 lb

## 2012-02-05 DIAGNOSIS — Z01419 Encounter for gynecological examination (general) (routine) without abnormal findings: Secondary | ICD-10-CM | POA: Insufficient documentation

## 2012-02-05 DIAGNOSIS — Z3480 Encounter for supervision of other normal pregnancy, unspecified trimester: Secondary | ICD-10-CM

## 2012-02-05 DIAGNOSIS — Z113 Encounter for screening for infections with a predominantly sexual mode of transmission: Secondary | ICD-10-CM | POA: Insufficient documentation

## 2012-02-05 DIAGNOSIS — Z23 Encounter for immunization: Secondary | ICD-10-CM

## 2012-02-05 DIAGNOSIS — O99019 Anemia complicating pregnancy, unspecified trimester: Secondary | ICD-10-CM | POA: Insufficient documentation

## 2012-02-05 DIAGNOSIS — D573 Sickle-cell trait: Secondary | ICD-10-CM

## 2012-02-05 DIAGNOSIS — D649 Anemia, unspecified: Secondary | ICD-10-CM

## 2012-02-05 DIAGNOSIS — O0942 Supervision of pregnancy with grand multiparity, second trimester: Secondary | ICD-10-CM

## 2012-02-05 DIAGNOSIS — Z1151 Encounter for screening for human papillomavirus (HPV): Secondary | ICD-10-CM | POA: Insufficient documentation

## 2012-02-05 DIAGNOSIS — O0940 Supervision of pregnancy with grand multiparity, unspecified trimester: Secondary | ICD-10-CM

## 2012-02-05 DIAGNOSIS — E669 Obesity, unspecified: Secondary | ICD-10-CM

## 2012-02-05 LAB — GLUCOSE, CAPILLARY: Glucose-Capillary: 130 mg/dL — ABNORMAL HIGH (ref 70–99)

## 2012-02-05 MED ORDER — DOCUSATE SODIUM 100 MG PO CAPS: 100.0000 mg | ORAL_CAPSULE | Freq: Two times a day (BID) | ORAL | Status: DC | PRN

## 2012-02-05 MED ORDER — FERROUS SULFATE 325 (65 FE) MG PO TABS: 325.0000 mg | ORAL_TABLET | Freq: Three times a day (TID) | ORAL | Status: DC

## 2012-02-05 NOTE — Progress Notes (Signed)
Subjective:  Kathryn Calderon 37 y.o. female  G11P10 0 0 9 at >20 weeks with unknown dating presenting for initial prenatal visit.  This is not a planned pregnancy. 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 Relationship with FOB: significant other, living together with 6 of 10 children  Pregnancy history fully reviewed. Father of child at visit. Father of 4 other children. No sickle cell trait. Did have a history of anti-E antibody with one of his children before.  PHQ9 score of 1.   Patient denies contractions/decreased fetal movement/discharge/blood from vagina/rush of fluid. Started feeling baby move 4 weeks ago.   Review of Systems:   Review of Systems see above. No fevers/nausea/vomiting/diarrhea.   Objective:     BP 119/81  Temp 98.6 F (37 C)  Wt 218 lb (98.884 kg)  LMP 09/26/2011 Physical Exam  Exam BP 119/81  Temp 98.6 F (37 C)  Wt 218 lb (98.884 kg)  LMP 09/26/2011  General Appearance:    Alert, cooperative, no distress, appears stated age, obese  Head:    Normocephalic, without obvious abnormality, atraumatic  Eyes:    PERRL, conjunctiva/corneas clear, EOM's intact  Nose:   Nares normal, septum midline, mucosa normal, no drainage    or sinus tenderness  Throat:   Lips, mucosa, and tongue normal; teeth and gums normal. Good dentition (sees dentist)   Neck:   Supple, symmetrical, trachea midline, no adenopathy;    thyroid:  no enlargement/tenderness/nodules  Back:     Symmetric, no curvature, ROM normal, no CVA tenderness  Lungs:     Clear to auscultation bilaterally, respirations unlabored   Heart:    Regular rate and rhythm, S1 and S2 normal, no murmur, rub   or gallop  Abdomen:     Soft, non-tender, bowel sounds active all four quadrants,    no masses, no organomegaly. Gravid. Fundus to 27cm.   Extremities:   Extremities normal, atraumatic, no cyanosis or edema  Skin:    Skin color, texture, turgor normal, no rashes or lesions with exception of some keloids from old scars on bilateral arms.   Neurologic:   Grossly normal.    Pelvic: cervix normal in appearance, external genitalia normal, vagina normal without discharge and appeared to have anteverted uterus with cervix plane about 30 degrees from paralell with table.      Assessment:    Pregnancy: [id number] Patient Active Problem List  Diagnosis  . Obesity, unspecified  . SICKLE CELL TRAIT  . Allergic rhinitis  . Supervision of pregnancy with grand multiparity in second trimester  . Anemia complicating pregnancy       Plan:     Will try to obtain records from abortion clinic. ROI signed today.  Flu shot today.  Glucola today family history DM and obesity.  Desires BTL. Will plan to set this up at next visit.  Unplanned pregnancy. Desired abortion but went to clinic and was told that it was too late. She was at least 20 weeks 1 month ago she reports. Unsure LMP.  Fundal height 27 cm and patient states at least 24 weeks. Plan for anatomy scan ASAP.  Has PCN allergy but unclear if history of GBS.  Patient with ? Of oligohydramnios and preterm labor without preterm delivery in previous pregnancy. May need to obtain records but uncertain what would add to care.   Counseling: Patient would like to meet with genetic counselor due to [facility].  Does not desire prenatal or parenting classes.   No weight gain since start of pregnancy. Not exercising or trying to eat well. Encouraged walking 10-15 minutes a day lightly to start and build up.  Rx for iron due to anemia. Sickle cell trait noted. Colace given to use prn. Encouraged PNV-using.  Completed [facility] forms-will need care manager.  Plans for pediatrician-[facility]. Father will need counseling for smoking cessation.   Will follow up with OB clinic at next available appointment since [facility].  Anti-E antibodies noted. Discussed with Dr. [place] who will  discuss case with [facility].   50% of 60 min visit spent on counseling and coordination of care.   Tana Conch 02/05/2012

## 2012-02-05 NOTE — Patient Instructions (Addendum)
Please make appointment for OB clinic at our office in ASAP with Dr. Mauricio Po or Dr. [place] We are going to schedule you an Ultrasound (nurses will give you an appointment).  You got a flu shot today.  We are going to refer you for genetics counseling.  We need records from the clinic you had the ultrasound.  We are going to start you on iron and give you something for constipation.  You got your sugar test today.  Keep taking your prenatal vitamin.  We are going to refer yoou to a care manager through the pregnancy center medical home.  You should try to walk at least once a day. Your goal weight gain should be less than 15 lbs.  We will need to start working on BTL papers at next visit.   Baby sounded great today!   You should see me at the latest in 4 weeks and see Ob clinic in between then,  Dr. Durene Cal

## 2012-02-06 ENCOUNTER — Telehealth: Payer: Self-pay | Admitting: Family Medicine

## 2012-02-06 NOTE — Telephone Encounter (Signed)
Called to give Kathryn Calderon her appointment with MFM for genetic counseling and she informed me she did not wish to do genetic counseling.  Appointment/referral cancelled.  Ileana Ladd

## 2012-02-06 NOTE — Addendum Note (Signed)
Addended by: Damita Lack on: 02/06/2012 02:18 PM   Modules accepted: Orders

## 2012-02-06 NOTE — Telephone Encounter (Signed)
Pt gave me info for the ROI. Kathryn Calderon, Kathryn Calderon

## 2012-02-06 NOTE — Telephone Encounter (Signed)
Pt is calling back and needs to speak to Shanda Bumps about information she has

## 2012-02-09 ENCOUNTER — Ambulatory Visit (HOSPITAL_COMMUNITY)
Admission: RE | Admit: 2012-02-09 | Discharge: 2012-02-09 | Disposition: A | Discharge status: Home / Self Care | Payer: Medicaid Other | Source: Ambulatory Visit | Attending: Family Medicine | Admitting: Family Medicine

## 2012-02-09 ENCOUNTER — Other Ambulatory Visit: Payer: Self-pay | Admitting: Family Medicine

## 2012-02-09 ENCOUNTER — Encounter: Payer: Self-pay | Admitting: Family Medicine

## 2012-02-09 DIAGNOSIS — O09529 Supervision of elderly multigravida, unspecified trimester: Secondary | ICD-10-CM | POA: Insufficient documentation

## 2012-02-09 DIAGNOSIS — O0942 Supervision of pregnancy with grand multiparity, second trimester: Secondary | ICD-10-CM

## 2012-02-09 DIAGNOSIS — O0930 Supervision of pregnancy with insufficient antenatal care, unspecified trimester: Secondary | ICD-10-CM | POA: Insufficient documentation

## 2012-02-09 DIAGNOSIS — Z23 Encounter for immunization: Secondary | ICD-10-CM

## 2012-02-09 DIAGNOSIS — Z3689 Encounter for other specified antenatal screening: Secondary | ICD-10-CM

## 2012-02-09 DIAGNOSIS — E669 Obesity, unspecified: Secondary | ICD-10-CM

## 2012-02-09 DIAGNOSIS — D573 Sickle-cell trait: Secondary | ICD-10-CM

## 2012-02-09 DIAGNOSIS — O99019 Anemia complicating pregnancy, unspecified trimester: Secondary | ICD-10-CM

## 2012-02-09 DIAGNOSIS — Z3480 Encounter for supervision of other normal pregnancy, unspecified trimester: Secondary | ICD-10-CM

## 2012-02-09 NOTE — Progress Notes (Addendum)
Note reviewed.  Agree with Dr. Erasmo Leventhal plan and note. Issues noted:   Anti E antibody - will investigate further to see if any intervention is needed. Obesity - glucola 130. Should have counseling re: weight gain in pregnancy. Poor dating.  EDD 05/16/12 by 26 week sono. AMA - too late for quad screen.  Could meet with genetics if interested. Unplanned pregnancy, grand multip - could use support from [facility] care manager. Anemia- already started on iron. Sickle trait positive- counseled by Dr. Durene Cal.

## 2012-02-12 ENCOUNTER — Encounter (HOSPITAL_COMMUNITY): Payer: Medicaid Other

## 2012-02-19 ENCOUNTER — Encounter: Payer: Medicaid Other | Admitting: Family Medicine

## 2012-02-19 ENCOUNTER — Ambulatory Visit (INDEPENDENT_AMBULATORY_CARE_PROVIDER_SITE_OTHER): Payer: Medicaid Other | Admitting: Family Medicine

## 2012-02-19 ENCOUNTER — Encounter: Payer: Self-pay | Admitting: Family Medicine

## 2012-02-19 VITALS — BP 119/76 | Wt 220.0 lb

## 2012-02-19 DIAGNOSIS — O360990 Maternal care for other rhesus isoimmunization, unspecified trimester, not applicable or unspecified: Secondary | ICD-10-CM

## 2012-02-19 DIAGNOSIS — Z23 Encounter for immunization: Secondary | ICD-10-CM

## 2012-02-19 DIAGNOSIS — O0942 Supervision of pregnancy with grand multiparity, second trimester: Secondary | ICD-10-CM

## 2012-02-19 DIAGNOSIS — O0940 Supervision of pregnancy with grand multiparity, unspecified trimester: Secondary | ICD-10-CM

## 2012-02-19 DIAGNOSIS — O361190 Maternal care for Anti-A sensitization, unspecified trimester, not applicable or unspecified: Secondary | ICD-10-CM

## 2012-02-19 LAB — CBC
HCT: 29.9 % — ABNORMAL LOW (ref 36.0–46.0)
Hemoglobin: 9.6 g/dL — ABNORMAL LOW (ref 12.0–15.0)
MCH: 21.5 pg — ABNORMAL LOW (ref 26.0–34.0)
MCHC: 32.1 g/dL (ref 30.0–36.0)
MCV: 67 fL — ABNORMAL LOW (ref 78.0–100.0)
RBC: 4.46 MIL/uL (ref 3.87–5.11)

## 2012-02-19 LAB — RPR

## 2012-02-19 NOTE — Patient Instructions (Addendum)
It was a pleasure to meet you today in [facility].  You are 27 weeks and 3 days today.  Iron tablets (Ferrous sulfate 325mg  tablets) 1 tablet twice daily.  Over the counter Colace (stool softener) if needed. Increased fiber and water in the diet.   We are drawing the routine labs for 28 week visit today.   Will look into the significance of the Anti-e antibody for your baby's health.   The Consent Form for Tubes tied to be signed today if you wish to have this done after delivery. (BTL CONSENT).  Preventing Preterm Labor Preterm labor is when a pregnant woman has contractions that cause the cervix to open, shorten, and thin before 37 weeks of pregnancy. You will have regular contractions (tightening) 2 to 3 minutes apart. This usually causes discomfort or pain. HOME CARE  Eat a healthy diet.  Take your vitamins as told by your doctor.  Drink enough fluids to keep your pee (urine) clear or pale yellow every day.  Get rest and sleep.  Do not have sex if you are at high risk for preterm labor.  Follow your doctor's advice about activity, medicines, and tests.  Avoid stress.  Avoid hard labor or exercise that lasts for a long time.  Do not smoke. GET HELP RIGHT AWAY IF:   You are having contractions.  You have belly (abdominal) pain.  You have bleeding from your vagina.  You have pain when you pee (urinate).  You have abnormal discharge from your vagina.  You have a temperature by mouth above 102 F (38.9 C). MAKE SURE YOU:  Understand these instructions.  Will watch your condition.  Will get help if you are not doing well or get worse. Document Released: 04/11/2008 Document Revised: 04/07/2011 Document Reviewed: 04/11/2008 [employer] [facility] Patient Information 2013 [place], [place].

## 2012-02-19 NOTE — Progress Notes (Signed)
Patient seen on OB Clinic; she is G11P10 at 27 3/7 weeks by [place].  She offers no complaints.  Been a nonsmoker for years.  Offered genetics consult for AMA, she declines this. Discussed Ab screen which was positive for AntiE Ab; she reports this was the case with her penultimate pregnancy (4 years ago), but negative Ab screen 2 years ago.  Same FOB for these pregnancies as with the present pregnancy.  Uncertain transfusion history.  Will do 28-week labs today (HIV, RPR, CBC).  She completed the PMH Risk Assessment Tool and is negative. Form forwarded to Medicaid case manager's box.  Discussed Anti-E with OB on-call, will refer patient for formal consult with MFM.   I discussed with patient that it is important that she be seen by MFM for this positive Ab screen and that it may have repercussions for the health of her baby, and she agrees.   Patient requests BTL after delivery.  She is counseled on this permanent form of sterilization.  She has signed the consent form today in our office. Follow up in 2 weeks with her primary physician in our office; MFM appointment as soon as possible.  Paula Compton, MD

## 2012-02-24 ENCOUNTER — Ambulatory Visit (HOSPITAL_COMMUNITY): Payer: Medicaid Other | Attending: Family Medicine

## 2012-02-29 ENCOUNTER — Encounter: Payer: Self-pay | Admitting: Family Medicine

## 2012-03-01 ENCOUNTER — Telehealth: Payer: Self-pay | Admitting: Family Medicine

## 2012-03-01 NOTE — Telephone Encounter (Signed)
Will forward to Lupita Leash and Dr.Breen as it looks as if they were discussing this. Fleeger, Maryjo Rochester

## 2012-03-01 NOTE — Telephone Encounter (Signed)
Patient was given an appointment with the Maternal-Fetal Medicine (MFM) practice at [facility]; she [facility] that appointment last week. JB

## 2012-03-01 NOTE — Telephone Encounter (Signed)
Pt is not sure if she needs appt here or at high risk clinic - was not told at last appt.

## 2012-03-02 ENCOUNTER — Other Ambulatory Visit: Payer: Self-pay

## 2012-03-02 ENCOUNTER — Ambulatory Visit (HOSPITAL_COMMUNITY)
Admission: RE | Admit: 2012-03-02 | Discharge: 2012-03-02 | Disposition: A | Discharge status: Home / Self Care | Payer: Medicaid Other | Source: Ambulatory Visit | Attending: Family Medicine | Admitting: Family Medicine

## 2012-03-02 DIAGNOSIS — O361190 Maternal care for Anti-A sensitization, unspecified trimester, not applicable or unspecified: Secondary | ICD-10-CM | POA: Insufficient documentation

## 2012-03-02 DIAGNOSIS — O09529 Supervision of elderly multigravida, unspecified trimester: Secondary | ICD-10-CM | POA: Insufficient documentation

## 2012-03-02 DIAGNOSIS — D573 Sickle-cell trait: Secondary | ICD-10-CM | POA: Insufficient documentation

## 2012-03-02 DIAGNOSIS — O99019 Anemia complicating pregnancy, unspecified trimester: Secondary | ICD-10-CM | POA: Insufficient documentation

## 2012-03-02 DIAGNOSIS — IMO0002 Reserved for concepts with insufficient information to code with codable children: Secondary | ICD-10-CM

## 2012-03-02 NOTE — Consult Note (Signed)
MFM consult  37 yr old [id number] at [redacted]w[redacted]d referred by Dr. 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  PMH: sickle cell trait PSH: none Past OB history: 10 full term vaginal deliveries; one child passed away at 3 months from pulmonary hypertension- do not have records; one child has sickle cell disease Meds: PNV, iron Allergies: penicillin  I counseled the patient as follows:  1. Anti-E isoimmunization: I discussed that this antibody can cause fetal anemia and hemolytic disease of the newborn. It increases the risk of neonatal jaundice and need for transfusion. I discussed there are several options for management. I discussed the option of testing the father of the baby for E antigen status. If the father of the baby is homozygous we can assume the fetus is E positive- of course assuming there is no question of paternity which I discussed with the patient. If the father of the baby is heterozygous there is the option of proceeding with an amniocentesis to determine the E antigen status of the fetus. We are assuming the father of the baby is at least heterozygous and that the patient was likely exposed with a prior pregnancy as there are no other risk factors. I discussed the benefits to amniocentesis would be to determine if the fetus is E antigen negative then there is no risk of hemolytic disease and no further follow up is necessary. I discussed risks of amniocentesis include bleeding, infection, rupture of membranes, and risk of pregnancy loss of 1/400, and risk of preterm labor/PPROM, preterm delivery. If the fetus is E antigen positive then surveillance is necessary- only if critical titer is reached. I discussed that if the patient chose not to have any of the above testing we would proceed with pregnancy management assuming the fetus is E antigen positive.  Surveillance should be as follows: I discussed at this point (as of 01/14/12) the anti-E titer is <2 and hemolytic  disease is not known to occur at this low titer. We repeated the titer today given it has been over 6 weeks. Recommend antibody titers every 2 weeks  If the titer becomes greater than the critical threshold of >1:16 we will start start fetal middle cerebral artery Doppler studies every 1-2 weeks to screen for fetal anemia If the MCA Doppler studies become abnormal (>1.5MoM) then we would recommend fetal percutaneous umbilical blood sampling and transfusion as necessary  If patient never has critical antibody titer would recommend delivery at 39 weeks and routine ob care  If patient has critical titer but MCA Doppler studies remain normal recommend delivery at 37-38 weeks  If fetus requires transfusions will make delivery recommendations based on clinical scenario  If patient has critical titer would recommend starting antenatal testing by 32 weeks.   At this point the patient did not want father of baby tested (as he does not have insurance) and declined amniocentesis; so we will proceed assuming fetus is at risk. Again given low antibody titer at this time no extra surveillance is indicated unless titer increases  2. Advanced maternal age - discussed increased risk of fetal aneuploidy - the patient did not have fetal aneuploidy screening done - discussed limitations of ultrasound in detecting fetal aneuploidy - discussed benefits/risks of amniocentesis including risks of bleeding, infection, and risk of pregnancy loss of 1/400; after counseling patient declined amniocentesis - also discussed options of screening tests: Harmony and the limitations of each in detecting fetal aneuploidy - offered genetic counseling referral - after counseling patient opted to  proceed with cell free fetal DNA (Harmony) screening which was drawn today  3. Sickle cell trait: - recommend that this be confirmed with a hemoglobin electrophoresis if not already done - patient is on iron for anemia, however, I recommend  obtaining iron studies (ferritin, TIBC, % saturation) and if normal discontinue iron as patients with sickle cell trait can have mild anemia in the setting of normal iron stores - offered screening for partner; however he does not have insurance - recommend inform Pediatrics at time of delivery; and screen in the neonatal period - this pregnancy is a different father than the father of her son with sickle cell disease  4. Previous child that died of pulmonary hypertension: - I do not have records from that infant - if it was due to underlying congenital heart disease there is a 3-4% risk of this fetus having a congenital heart defect and I would recommend obtaining a fetal echocardiogram as soon as possible - otherwise recurrence risk would depend on etiology  I spent 40 minutes in face to face consultation with the patient in addition to time spent on the ultrasound.  Eulis Foster, MD

## 2012-03-03 NOTE — Telephone Encounter (Signed)
Pt went yesterday and the doctor told her that she needs to be seen here for her low iron and something else (states that it would be in her notes) - wants to know if Dr Durene Cal has seen the notes before she reschedules her appt with Dr Mauricio Po on Friday

## 2012-03-03 NOTE — Addendum Note (Signed)
Encounter addended by: Ty Hilts, RN on: 03/03/2012  8:41 AM<BR>     Documentation filed: Charges VN

## 2012-03-03 NOTE — Telephone Encounter (Signed)
Patient needs appointment with MFM  (maternal fetal medicine) after not showing for visit. Needs visit here to rediscuss this visit and importance of going to MFM. Will ask staff to call to schedule by end of the month.

## 2012-03-04 ENCOUNTER — Encounter: Payer: Self-pay | Admitting: Family Medicine

## 2012-03-04 NOTE — Telephone Encounter (Signed)
Frequent follow up will benefit patient. She is welcome to keep appointment to discuss plans from maternal fetal medicine.

## 2012-03-05 ENCOUNTER — Encounter: Payer: Medicaid Other | Admitting: Family Medicine

## 2012-03-08 ENCOUNTER — Telehealth (HOSPITAL_COMMUNITY): Payer: Self-pay | Admitting: *Deleted

## 2012-03-08 NOTE — Telephone Encounter (Signed)
Attempted to call pat to give her lab results.  Unable to reach pt.  Will fax lab results to MD's office.

## 2012-03-15 ENCOUNTER — Telehealth (HOSPITAL_COMMUNITY): Payer: Self-pay | Admitting: MS"

## 2012-03-15 NOTE — Telephone Encounter (Signed)
Called Kathryn Calderon to discuss her Harmony, cell free fetal DNA testing. Testing was offered because of advanced maternal age. We reviewed that these are within normal limits, showing a less than 1 in 10,000 risk for trisomies 21, 18 and 13. We reviewed that this testing identifies > 99% of pregnancies with trisomy 21, >98% of pregnancies with trisomy 22, and >80% with trisomy 24; the false positive rate is <0.1% for all conditions. She understands that this testing does not identify all genetic conditions. All questions were answered to her satisfaction, she was encouraged to call with additional questions or concerns.   [name] asked if she needs to continue to take iron. Discussed that I was not able to answer that question or questions about those types of labs and that the nurse with her OB office can better talk with her about those concerns.   Quinn Plowman, MS Certified Genetic Counselor 03/15/2012 3:57 PM

## 2012-03-19 ENCOUNTER — Encounter: Payer: Medicaid Other | Admitting: Family Medicine

## 2012-03-24 ENCOUNTER — Encounter: Payer: Self-pay | Admitting: Family Medicine

## 2012-03-24 ENCOUNTER — Ambulatory Visit (INDEPENDENT_AMBULATORY_CARE_PROVIDER_SITE_OTHER): Payer: Medicaid Other | Admitting: Family Medicine

## 2012-03-24 ENCOUNTER — Other Ambulatory Visit: Payer: Self-pay | Admitting: Family Medicine

## 2012-03-24 VITALS — BP 118/76 | Temp 98.4°F | Wt 219.0 lb

## 2012-03-24 DIAGNOSIS — O9902 Anemia complicating childbirth: Secondary | ICD-10-CM

## 2012-03-24 DIAGNOSIS — O99013 Anemia complicating pregnancy, third trimester: Secondary | ICD-10-CM

## 2012-03-24 DIAGNOSIS — O361190 Maternal care for Anti-A sensitization, unspecified trimester, not applicable or unspecified: Secondary | ICD-10-CM

## 2012-03-24 DIAGNOSIS — D573 Sickle-cell trait: Secondary | ICD-10-CM

## 2012-03-24 DIAGNOSIS — Z3480 Encounter for supervision of other normal pregnancy, unspecified trimester: Secondary | ICD-10-CM

## 2012-03-24 LAB — IRON AND TIBC
Iron: 16 ug/dL — ABNORMAL LOW (ref 42–145)
UIBC: 560 ug/dL — ABNORMAL HIGH (ref 125–400)

## 2012-03-24 LAB — FERRITIN: Ferritin: 7 ng/mL — ABNORMAL LOW (ref 10–291)

## 2012-03-24 NOTE — Progress Notes (Signed)
Kathryn Calderon 37 y.o. female  [id number] 0 0 9 at [redacted]w[redacted]d presenting for routine prenatal care.  No headache, changes in vision, shortness of breath, right upper quadrant pain, or increased edema.  Initial BP >130 but repeat >120 and consistent with previous values. If elevated in future, will consider pr/cr ratio.  Patient denies contractions/decreased fetal movement/discharge/blood from vagina/rush of fluid.  1 hour glucola today. Previous value was 130. High concern for development of diabetes.  BTL papers already signed.  Iron studies as instructed by MFM given history of sickle cell trait. Hgb electrophoresis for confirmation per their request.  Patient had genetics counseling at MFM and had Harmony test showing 1/10,000 risk of trisomy.  Patient planning on bottle feeding. Baby boy Discussed signs of labor and safe sleep.  Regarding anti-E antiobdy, obtained titers today. If increasing will need to return to MFM.  Patient continues to refuse amnio and having FOB tested.  FOB also refusing testing for sickle cell though denies history of trait.  Child at risk for hemolytic anemia and sickle cell.  Fetal echo suggested by MFM if previous pulm htn in child that passed at 3 weeks was related to Congenital heart disease. Family uncertain of cause. Will ask for input from Dr. Mauricio Po on this subject.

## 2012-03-24 NOTE — Patient Instructions (Signed)
We got some labwork today including labs to evaluate your iron, sickle cell trait, antibodies in your blood. We will let you know if we need to make any changes. i am concerned you may have developed diabetes and we may need to send you to the other clinic for care.  Follow up in 2 weeks with me or Dr. Mauricio Po in [facility] clinic.   Preventing Preterm Labor Preterm labor is when a pregnant woman has contractions that cause the cervix to open, shorten, and thin before 37 weeks of pregnancy. You will have regular contractions (tightening) 2 to 3 minutes apart. This usually causes discomfort or pain. HOME CARE  Eat a healthy diet.  Take your vitamins as told by your doctor.  Drink enough fluids to keep your pee (urine) clear or pale yellow every day.  Get rest and sleep.  Do not have sex if you are at high risk for preterm labor.  Follow your doctor's advice about activity, medicines, and tests.  Avoid stress.  Avoid hard labor or exercise that lasts for a long time.  Do not smoke. GET HELP RIGHT AWAY IF:   You are having contractions.  You have belly (abdominal) pain.  You have bleeding from your vagina.  You have pain when you pee (urinate).  You have abnormal discharge from your vagina.  You have a temperature by mouth above 102 F (38.9 C). MAKE SURE YOU:  Understand these instructions.  Will watch your condition.  Will get help if you are not doing well or get worse. Document Released: 04/11/2008 Document Revised: 04/07/2011 Document Reviewed: 04/11/2008 [facility] Patient Information 2013 [place], [place].

## 2012-03-25 LAB — ANTIBODY SCREEN: Antibody Screen: POSITIVE — AB

## 2012-03-25 LAB — ANTIBODY TITER (PRENATAL TITER): Ab Titer: 2

## 2012-03-26 LAB — HEMOGLOBINOPATHY EVALUATION
Hemoglobin Other: 0 %
Hgb F Quant: 0.2 % (ref 0.0–2.0)
Hgb S Quant: 32.5 % — ABNORMAL HIGH

## 2012-03-30 ENCOUNTER — Encounter: Payer: Self-pay | Admitting: Family Medicine

## 2012-03-30 ENCOUNTER — Telehealth: Payer: Self-pay | Admitting: Family Medicine

## 2012-03-30 NOTE — Telephone Encounter (Signed)
Informed patient of the following  1. Patient's iron studies showed she has low iron so she should restart this medicine as previously prescribed.   2. Will get fetal echo at request of MFM.    3. Her anti-E antibody remained at same level and should be repeated in 2 weeks.   4. Passed 1 hour glucola 113. Previous early glucola at 130.

## 2012-03-30 NOTE — Telephone Encounter (Signed)
LMOVM for pt to call back  The ultrasound that Dr. Durene Cal wanted her to have must be scheduled with Dr. Elizebeth Brooking @ [facility].  The appt has been made as follows:  04/01/12 @ 11am [facility] [address] [place], [place] [address]  [phone] (Office)  She went here once before in 2009 for a previous pregnancy.  Please inform her when she calls back.  If she has not called back by tomorrow, I will call to reschedule.

## 2012-03-31 NOTE — Telephone Encounter (Signed)
Pt informed of appointment. Fleeger, Maryjo [place]

## 2012-04-08 ENCOUNTER — Encounter: Payer: Medicaid Other | Admitting: Family Medicine

## 2012-04-08 ENCOUNTER — Encounter: Payer: Self-pay | Admitting: Family Medicine

## 2012-04-08 ENCOUNTER — Ambulatory Visit (INDEPENDENT_AMBULATORY_CARE_PROVIDER_SITE_OTHER): Payer: Medicaid Other | Admitting: Family Medicine

## 2012-04-08 VITALS — BP 124/79 | Wt 221.0 lb

## 2012-04-08 DIAGNOSIS — O361190 Maternal care for Anti-A sensitization, unspecified trimester, not applicable or unspecified: Secondary | ICD-10-CM

## 2012-04-08 NOTE — Progress Notes (Signed)
Kathryn Calderon 37 y.o. female  [id number] at [redacted]w[redacted]d presenting for routine follow up.  Patient denies contractions/decreased fetal movement/discharge/blood from vagina/rush of fluid. Does feel some intermittent low abdomen pressure that is only occasional.  Plans to bottle feed.  Female. Not sure of name. PCP will be [facility].  Discussed normal fetal echo.  Discussed patient does have confirmed sickle trait. Still dose not want partner tested.  Taking iron Sent to lab for repeat anti-E testing.  Advised tums for heartburn.  Baby not vertex at this time, will need to repeat exam at next visit and consider u/s in office if not head down by 36-37 weeks.  F/u within 2 weeks with OB clinic. If no appt available, will see me.  Needs cultures if seen after 36 weeks.

## 2012-04-08 NOTE — Patient Instructions (Signed)
Follow up at the next available OB appointment with Dr. Mauricio Po. Make sure that is no later than 2 weeks, if it is, then see me at that time.  We checked your bloodwork again today.  Remember to keep doing kick counts if baby isnt moving as much.  For heartburn you can take Tums.  The heart picture of the baby looked ok.  Keep taking your iron.   Circumcision options:  1. [facility] [phone]. $320 within 4 weeks of birth 2. [facility]. [phone]. $250 within 7 days of birth  Preventing Preterm Labor Preterm labor is when a pregnant woman has contractions that cause the cervix to open, shorten, and thin before 37 weeks of pregnancy. You will have regular contractions (tightening) 2 to 3 minutes apart. This usually causes discomfort or pain. HOME CARE  Eat a healthy diet.  Take your vitamins as told by your doctor.  Drink enough fluids to keep your pee (urine) clear or pale yellow every day.  Get rest and sleep.  Do not have sex if you are at high risk for preterm labor.  Follow your doctor's advice about activity, medicines, and tests.  Avoid stress.  Avoid hard labor or exercise that lasts for a long time.  Do not smoke. GET HELP RIGHT AWAY IF:   You are having contractions.  You have belly (abdominal) pain.  You have bleeding from your vagina.  You have pain when you pee (urinate).  You have abnormal discharge from your vagina.  You have a temperature by mouth above 102 F (38.9 C). MAKE SURE YOU:  Understand these instructions.  Will watch your condition.  Will get help if you are not doing well or get worse. Document Released: 04/11/2008 Document Revised: 04/07/2011 Document Reviewed: 04/11/2008 [facility] Patient Information 2013 [place], [place].

## 2012-04-09 LAB — ANTIBODY TITER (PRENATAL TITER): Ab Titer: <2

## 2012-04-09 LAB — ANTIBODY SCREEN: Antibody Screen: POSITIVE — AB

## 2012-04-09 LAB — PRENATAL ANTIBODY IDENTIFICATION

## 2012-04-09 NOTE — Progress Notes (Signed)
This encounter was created in error - please disregard.

## 2012-04-15 NOTE — Progress Notes (Signed)
Of note, the Fetal ECHO report from [facility], performed on 04/01/2012, is reported as a normal fetal ECHO. For follow up at 2-week intervals until 36 weeks, then weekly. JB

## 2012-04-22 ENCOUNTER — Ambulatory Visit: Payer: Medicaid Other

## 2012-04-28 ENCOUNTER — Encounter: Payer: Medicaid Other | Admitting: Family Medicine

## 2012-04-30 ENCOUNTER — Ambulatory Visit (INDEPENDENT_AMBULATORY_CARE_PROVIDER_SITE_OTHER): Payer: Medicaid Other | Admitting: Family Medicine

## 2012-04-30 ENCOUNTER — Other Ambulatory Visit (HOSPITAL_COMMUNITY)
Admission: RE | Admit: 2012-04-30 | Discharge: 2012-04-30 | Disposition: A | Discharge status: Home / Self Care | Payer: Medicaid Other | Source: Ambulatory Visit | Attending: Family Medicine | Admitting: Family Medicine

## 2012-04-30 VITALS — BP 112/74 | Temp 98.4°F | Wt 224.0 lb

## 2012-04-30 DIAGNOSIS — O0942 Supervision of pregnancy with grand multiparity, second trimester: Secondary | ICD-10-CM

## 2012-04-30 DIAGNOSIS — Z113 Encounter for screening for infections with a predominantly sexual mode of transmission: Secondary | ICD-10-CM | POA: Insufficient documentation

## 2012-04-30 DIAGNOSIS — O360931 Maternal care for other rhesus isoimmunization, third trimester, fetus 1: Secondary | ICD-10-CM

## 2012-04-30 DIAGNOSIS — O361190 Maternal care for Anti-A sensitization, unspecified trimester, not applicable or unspecified: Secondary | ICD-10-CM

## 2012-04-30 DIAGNOSIS — K219 Gastro-esophageal reflux disease without esophagitis: Secondary | ICD-10-CM

## 2012-04-30 DIAGNOSIS — K649 Unspecified hemorrhoids: Secondary | ICD-10-CM

## 2012-04-30 DIAGNOSIS — O0940 Supervision of pregnancy with grand multiparity, unspecified trimester: Secondary | ICD-10-CM

## 2012-04-30 MED ORDER — RANITIDINE HCL 150 MG PO TABS: 150.0000 mg | ORAL_TABLET | Freq: Two times a day (BID) | ORAL | Status: DC

## 2012-04-30 MED ORDER — HYDROCORTISONE ACETATE 25 MG RE SUPP: 25.0000 mg | Freq: Two times a day (BID) | RECTAL | Status: DC | PRN

## 2012-04-30 NOTE — Assessment & Plan Note (Signed)
Check titer today.

## 2012-04-30 NOTE — Progress Notes (Signed)
Kathryn Calderon 37 y.o. female  [id number] at [redacted]w[redacted]d presenting for routine follow up.  Patient denies contractions (other than irritability)/decreased fetal movement/discharge/blood from vagina/rush of fluid. Does feel some intermittent low abdomen pressure that is becoming more regular.   Plans to bottle feed. Female. Not sure of name still. PCP will be [facility].  Discussed patient does have confirmed sickle trait. Partner still does not want testing due to no insurance.   Taking iron 3x daily. Some hemorrhoids, not taking colace yet.  Sent to lab for repeat anti-E testing. At last visit, titers unchanged.  Advised tums for heartburn. Also rx for h2 blocker given For hemorrhoids, anusol suppositories ordered Baby now vertex.  F/u in 1 week with OB clinic. In 2 weeks, to establish back up provider with Dr. Konrad Dolores or Dr. Mikel Cella as I am on vacation on due date.  GBC/gc/chlamydia obtained today.  1/thick/ballotable on exam.

## 2012-04-30 NOTE — Patient Instructions (Addendum)
I sent you in some suppositories for your hemorrhoids.  You can take the Zantac twice a day with meals for your reflux.  Follow up in ob clinic in 1 week. Try to see Dr. Konrad Dolores or Dr. Mikel Cella the week after that so you can have a back up doctor from our clinic.  Remember reasons we discussed for going to hospital (regular contractions, rush of fluids, blood from vagina (may have spotting today), decreased movement-kick counts, abnormal discharge)  Thanks, Dr. Durene Cal  We checked your bloodwork again today.  Remember to keep doing kick counts if baby isnt moving as much.  Keep taking your iron.   Circumcision options:  1. [facility] [phone]. $320 within 4 weeks of birth 2. [facility]. [phone]. $250 within 7 days of birth  Fetal Movement Counts Patient Name: __________________________________________________ Patient Due Date: ____________________ Melody Haver counts is highly recommended in high risk pregnancies, but it is a good idea for every pregnant woman to do. Start counting fetal movements at 28 weeks of the pregnancy. Fetal movements increase after eating a full meal or eating or drinking something sweet (the blood sugar is higher). It is also important to drink plenty of fluids (well hydrated) before doing the count. Lie on your left side because it helps with the circulation or you can sit in a comfortable chair with your arms over your belly (abdomen) with no distractions around you. DOING THE COUNT  Try to do the count the same time of day each time you do it.  Mark the day and time, then see how long it takes for you to feel 10 movements (kicks, flutters, swishes, rolls). You should have at least 10 movements within 2 hours. You will most likely feel 10 movements in much less than 2 hours. If you do not, wait an hour and count again. After a couple of days you will see a pattern.  What you are looking for is a change in the pattern or not enough counts in 2 hours. Is it  taking longer in time to reach 10 movements? SEEK MEDICAL CARE IF:  You feel less than 10 counts in 2 hours. Tried twice.  No movement in one hour.  The pattern is changing or taking longer each day to reach 10 counts in 2 hours.  You feel the baby is not moving as it usually does. Date: ____________ Movements: ____________ Start time: ____________ Doreatha Teti time: ____________  Date: ____________ Movements: ____________ Start time: ____________ Doreatha Yasui time: ____________ Date: ____________ Movements: ____________ Start time: ____________ Doreatha Lantz time: ____________ Date: ____________ Movements: ____________ Start time: ____________ Doreatha Crossin time: ____________ Date: ____________ Movements: ____________ Start time: ____________ Doreatha Landstrom time: ____________ Date: ____________ Movements: ____________ Start time: ____________ Doreatha Hynes time: ____________ Date: ____________ Movements: ____________ Start time: ____________ Doreatha Mcnicholas time: ____________ Date: ____________ Movements: ____________ Start time: ____________ Doreatha Marasigan time: ____________  Date: ____________ Movements: ____________ Start time: ____________ Doreatha Zentner time: ____________ Date: ____________ Movements: ____________ Start time: ____________ Doreatha Kingsbury time: ____________ Date: ____________ Movements: ____________ Start time: ____________ Doreatha Gohr time: ____________ Date: ____________ Movements: ____________ Start time: ____________ Doreatha Roddy time: ____________ Date: ____________ Movements: ____________ Start time: ____________ Doreatha Som time: ____________ Date: ____________ Movements: ____________ Start time: ____________ Doreatha Forrester time: ____________ Date: ____________ Movements: ____________ Start time: ____________ Doreatha Poppen time: ____________  Date: ____________ Movements: ____________ Start time: ____________ Doreatha Ogborn time: ____________ Date: ____________ Movements: ____________ Start time: ____________ Doreatha Crosland time: ____________ Date: ____________ Movements:  ____________ Start time: ____________ Doreatha Oberholzer time: ____________ Date: ____________ Movements: ____________ Start time: ____________ Doreatha Wicklund  time: ____________ Date: ____________ Movements: ____________ Start time: ____________ Doreatha Brittingham time: ____________ Date: ____________ Movements: ____________ Start time: ____________ Doreatha Prine time: ____________ Date: ____________ Movements: ____________ Start time: ____________ Doreatha Wherley time: ____________  Date: ____________ Movements: ____________ Start time: ____________ Doreatha Masaki time: ____________ Date: ____________ Movements: ____________ Start time: ____________ Doreatha Clendenen time: ____________ Date: ____________ Movements: ____________ Start time: ____________ Doreatha Newcombe time: ____________ Date: ____________ Movements: ____________ Start time: ____________ Doreatha Kirchner time: ____________ Date: ____________ Movements: ____________ Start time: ____________ Doreatha Sakamoto time: ____________ Date: ____________ Movements: ____________ Start time: ____________ Doreatha Rund time: ____________ Date: ____________ Movements: ____________ Start time: ____________ Doreatha Stamant time: ____________  Date: ____________ Movements: ____________ Start time: ____________ Doreatha Petzold time: ____________ Date: ____________ Movements: ____________ Start time: ____________ Doreatha Bogdan time: ____________ Date: ____________ Movements: ____________ Start time: ____________ Doreatha Botz time: ____________ Date: ____________ Movements: ____________ Start time: ____________ Doreatha Milson time: ____________ Date: ____________ Movements: ____________ Start time: ____________ Doreatha Calef time: ____________ Date: ____________ Movements: ____________ Start time: ____________ Doreatha Felling time: ____________ Date: ____________ Movements: ____________ Start time: ____________ Doreatha Boerner time: ____________  Date: ____________ Movements: ____________ Start time: ____________ Doreatha Cerrone time: ____________ Date: ____________ Movements: ____________ Start time: ____________ Doreatha Bowie  time: ____________ Date: ____________ Movements: ____________ Start time: ____________ Doreatha Asay time: ____________ Date: ____________ Movements: ____________ Start time: ____________ Doreatha Eaglin time: ____________ Date: ____________ Movements: ____________ Start time: ____________ Doreatha Waltz time: ____________ Date: ____________ Movements: ____________ Start time: ____________ Doreatha Guo time: ____________ Date: ____________ Movements: ____________ Start time: ____________ Doreatha Wingler time: ____________  Date: ____________ Movements: ____________ Start time: ____________ Doreatha Shanholtzer time: ____________ Date: ____________ Movements: ____________ Start time: ____________ Doreatha Crossley time: ____________ Date: ____________ Movements: ____________ Start time: ____________ Doreatha Jagoda time: ____________ Date: ____________ Movements: ____________ Start time: ____________ Doreatha Strehle time: ____________ Date: ____________ Movements: ____________ Start time: ____________ Doreatha Sharrar time: ____________ Date: ____________ Movements: ____________ Start time: ____________ Doreatha Francis time: ____________ Date: ____________ Movements: ____________ Start time: ____________ Doreatha Damas time: ____________  Date: ____________ Movements: ____________ Start time: ____________ Doreatha Peckman time: ____________ Date: ____________ Movements: ____________ Start time: ____________ Doreatha Zettler time: ____________ Date: ____________ Movements: ____________ Start time: ____________ Doreatha Seidl time: ____________ Date: ____________ Movements: ____________ Start time: ____________ Doreatha Contee time: ____________ Date: ____________ Movements: ____________ Start time: ____________ Doreatha Foss time: ____________ Date: ____________ Movements: ____________ Start time: ____________ Doreatha Cothern time: ____________ Document Released: 02/12/2006 Document Revised: 04/07/2011 Document Reviewed: 08/15/2008 [employer] Patient Information 2013 [employer].

## 2012-05-03 LAB — PRENATAL ANTIBODY IDENTIFICATION

## 2012-05-06 ENCOUNTER — Ambulatory Visit (INDEPENDENT_AMBULATORY_CARE_PROVIDER_SITE_OTHER): Payer: Medicaid Other | Admitting: Family Medicine

## 2012-05-06 VITALS — BP 118/72 | Temp 98.0°F | Wt 225.3 lb

## 2012-05-06 DIAGNOSIS — O0940 Supervision of pregnancy with grand multiparity, unspecified trimester: Secondary | ICD-10-CM

## 2012-05-06 DIAGNOSIS — O0942 Supervision of pregnancy with grand multiparity, second trimester: Secondary | ICD-10-CM

## 2012-05-06 NOTE — Assessment & Plan Note (Signed)
See Vitals and Notes.

## 2012-05-06 NOTE — Progress Notes (Signed)
Patient seen in 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  She is GBS negative and cervical cultures were also negative at that time.  She describes back pain, some infrequent contractions that are not frequent enough to be timed.  No LOF, no vaginal bleeding.  Feels fetal movement.  In reviewing notes from [facility] 03/02/2012, recommendation for delivery at 39 weeks if titers remained low.  We will schedule her for induction on L&D after this Sunday, 4/13, when she completes 39 weeks. All prior deliveries have been NSVDs.  Discussed induction <40 weeks with Dr. Debroah Loop by phone today.  Discussed labor precautions with patient. Confirmed vertex lie with Leopold's maneuvers and bedside US in [facility]. Paula Compton, MD

## 2012-05-06 NOTE — Patient Instructions (Addendum)
It was a pleasure to see you today. You are 38 weeks and 4 days along in your pregnancy today.  Your Anti-E antibody levels have remained low, which is good.   As we discussed, it is recommended that you be induced at 39 weeks.  We are scheduling your induction at [facility] for this coming week (Tuesday, April 15th at 07:30am).   Labor Induction  Most women go into labor on their own between 17 and 42 weeks of the pregnancy. When this does not happen or when there is a medical need, medicine or other methods may be used to induce labor. Labor induction causes a pregnant woman's uterus to contract. It also causes the cervix to soften (ripen), open (dilate), and thin out (efface). Usually, labor is not induced before 39 weeks of the pregnancy unless there is a problem with the baby or mother. Whether your labor will be induced depends on a number of factors, including the following:  The medical condition of you and the baby.  How many weeks along you are.  The status of baby's lung maturity.  The condition of the cervix.  The position of the baby. REASONS FOR LABOR INDUCTION  The health of the baby or mother is at risk.  The pregnancy is overdue by 1 week or more.  The water breaks but labor does not start on its own.  The mother has a health condition or serious illness such as high blood pressure, infection, placental abruption, or diabetes.  The amniotic fluid amounts are low around the baby.  The baby is distressed. REASONS TO NOT INDUCE LABOR Labor induction may not be a good idea if:  It is shown that your baby does not tolerate labor.  An induction is just more convenient.  You want the baby to be born on a certain date, like a holiday.  You have had previous surgeries on your uterus, such as a myomectomy or the removal of fibroids.  Your placenta lies very low in the uterus and blocks the opening of the cervix (placenta previa).  Your baby is not in a head  down position.  The umbilical cord drops down into the birth canal in front of the baby. This could cut off the baby's blood and oxygen supply.  You have had a previous cesarean delivery.  There areunusual circumstances, such as the baby being extremely premature. RISKS AND COMPLICATIONS Problems may occur in the process of induction and plans may need to be modified as a situation unfolds. Some of the risks of induction include:  Change in fetal heart rate, such as too high, too low, or erratic.  Risk of fetal distress.  Risk of infection to mother and baby.  Increased chance of having a cesarean delivery.  The rare, but increased chance that the placenta will separate from the uterus (abruption).  Uterine rupture (very rare). When induction is needed for medical reasons, the benefits of induction may outweigh the risks. BEFORE THE PROCEDURE Your caregiver will check your cervix and the baby's position. This will help your caregiver decide if you are far enough along for an induction to work. PROCEDURE Several methods of labor induction may be used, such as:   Taking prostaglandin medicine to dilate and ripen the cervix. The medicine will also start contractions. It can be taken by mouth or by inserting a suppository into the vagina.  A thin tube (catheter) with a balloon on the end may be inserted into your vagina to  dilate the cervix. Once inserted, the balloon expands with water, which causes the cervix to open.  Striping the membranes. Your caregiver inserts a finger between the cervix and membranes, which causes the cervix to be stretched and may cause the uterus to contract. This is often done during an office visit. You will be sent home to wait for the contractions to begin. You will then come in for an induction.  Breaking the water. Your caregiver will make a hole in the amniotic sac using a small instrument. Once the amniotic sac breaks, contractions should begin. This  may still take hours to see an effect.  Taking medicine to trigger or strengthen contractions. This medicine is given intravenously through a tube in your arm. All of the methods of induction, besides stripping the membranes, will be done in the hospital. Induction is done in the hospital so that you and the baby can be carefully monitored. AFTER THE PROCEDURE Some inductions can take up to 2 or 3 days. Depending on the cervix, it usually takes less time. It takes longer when you are induced early in the pregnancy or if this is your first pregnancy. If a mother is still pregnant and the induction has been going on for 2 to 3 days, either the mother will be sent home or a cesarean delivery will be needed. Document Released: 06/04/2006 Document Revised: 04/07/2011 Document Reviewed: 11/18/2010 [facility] Patient Information 2013 [place], [place].

## 2012-05-09 ENCOUNTER — Inpatient Hospital Stay (HOSPITAL_COMMUNITY): Payer: Medicaid Other

## 2012-05-10 ENCOUNTER — Telehealth (HOSPITAL_COMMUNITY): Payer: Self-pay | Admitting: *Deleted

## 2012-05-10 ENCOUNTER — Encounter (HOSPITAL_COMMUNITY): Payer: Self-pay | Admitting: *Deleted

## 2012-05-10 NOTE — Telephone Encounter (Signed)
Preadmission screen  

## 2012-05-11 ENCOUNTER — Inpatient Hospital Stay (HOSPITAL_COMMUNITY): Payer: Medicaid Other | Admitting: Anesthesiology

## 2012-05-11 ENCOUNTER — Encounter (HOSPITAL_COMMUNITY): Payer: Self-pay

## 2012-05-11 ENCOUNTER — Inpatient Hospital Stay (HOSPITAL_COMMUNITY)
Admission: RE | Admit: 2012-05-11 | Discharge: 2012-05-14 | DRG: 774 | Disposition: A | Discharge status: Home / Self Care | Payer: Medicaid Other | Source: Ambulatory Visit | Attending: Obstetrics & Gynecology | Admitting: Obstetrics & Gynecology

## 2012-05-11 ENCOUNTER — Encounter (HOSPITAL_COMMUNITY): Payer: Self-pay | Admitting: Anesthesiology

## 2012-05-11 VITALS — BP 120/77 | HR 85 | Temp 98.4°F | Resp 20 | Ht 63.5 in | Wt 225.0 lb

## 2012-05-11 DIAGNOSIS — O09529 Supervision of elderly multigravida, unspecified trimester: Secondary | ICD-10-CM | POA: Diagnosis present

## 2012-05-11 DIAGNOSIS — O361190 Maternal care for Anti-A sensitization, unspecified trimester, not applicable or unspecified: Principal | ICD-10-CM | POA: Diagnosis present

## 2012-05-11 DIAGNOSIS — O0942 Supervision of pregnancy with grand multiparity, second trimester: Secondary | ICD-10-CM

## 2012-05-11 DIAGNOSIS — O9902 Anemia complicating childbirth: Secondary | ICD-10-CM | POA: Diagnosis present

## 2012-05-11 DIAGNOSIS — D573 Sickle-cell trait: Secondary | ICD-10-CM | POA: Diagnosis present

## 2012-05-11 LAB — CBC
HCT: 31.1 % — ABNORMAL LOW (ref 36.0–46.0)
MCH: 23.2 pg — ABNORMAL LOW (ref 26.0–34.0)
MCV: 70.8 fL — ABNORMAL LOW (ref 78.0–100.0)
Platelets: 267 10*3/uL (ref 150–400)
RBC: 4.39 MIL/uL (ref 3.87–5.11)
WBC: 10.2 10*3/uL (ref 4.0–10.5)

## 2012-05-11 LAB — OB RESULTS CONSOLE GC/CHLAMYDIA: Chlamydia: NEGATIVE

## 2012-05-11 LAB — RPR: RPR Ser Ql: NONREACTIVE

## 2012-05-11 MED ORDER — LIDOCAINE HCL (PF) 1 % IJ SOLN
INTRAMUSCULAR | Status: DC | PRN
  Administered 2012-05-11 (×2): 5 mL

## 2012-05-11 MED ORDER — PHENYLEPHRINE 40 MCG/ML (10ML) SYRINGE FOR IV PUSH (FOR BLOOD PRESSURE SUPPORT)
80.0000 ug | PREFILLED_SYRINGE | INTRAVENOUS | Status: DC | PRN
  Filled 2012-05-11: qty 5
  Filled 2012-05-11: qty 2

## 2012-05-11 MED ORDER — LACTATED RINGERS IV SOLN
INTRAVENOUS | Status: DC
  Administered 2012-05-11 – 2012-05-12 (×3): via INTRAVENOUS

## 2012-05-11 MED ORDER — OXYTOCIN BOLUS FROM INFUSION
500.0000 mL | INTRAVENOUS | Status: DC
  Administered 2012-05-12: 500 mL via INTRAVENOUS

## 2012-05-11 MED ORDER — ONDANSETRON HCL 4 MG/2ML IJ SOLN
4.0000 mg | Freq: Four times a day (QID) | INTRAMUSCULAR | Status: DC | PRN
  Administered 2012-05-12: 4 mg via INTRAVENOUS
  Filled 2012-05-11: qty 2

## 2012-05-11 MED ORDER — DIPHENHYDRAMINE HCL 50 MG/ML IJ SOLN: 12.5000 mg | INTRAMUSCULAR | Status: DC | PRN

## 2012-05-11 MED ORDER — TERBUTALINE SULFATE 1 MG/ML IJ SOLN: 0.2500 mg | Freq: Once | INTRAMUSCULAR | Status: AC | PRN

## 2012-05-11 MED ORDER — ACETAMINOPHEN 325 MG PO TABS: 650.0000 mg | ORAL_TABLET | ORAL | Status: DC | PRN

## 2012-05-11 MED ORDER — CITRIC ACID-SODIUM CITRATE 334-500 MG/5ML PO SOLN: 30.0000 mL | ORAL | Status: DC | PRN

## 2012-05-11 MED ORDER — NALBUPHINE SYRINGE 5 MG/0.5 ML
10.0000 mg | INJECTION | INTRAMUSCULAR | Status: DC | PRN
  Filled 2012-05-11: qty 1

## 2012-05-11 MED ORDER — MISOPROSTOL 200 MCG PO TABS
ORAL_TABLET | ORAL | Status: AC
  Administered 2012-05-12: 1000 ug via RECTAL
  Filled 2012-05-11: qty 5

## 2012-05-11 MED ORDER — PHENYLEPHRINE 40 MCG/ML (10ML) SYRINGE FOR IV PUSH (FOR BLOOD PRESSURE SUPPORT)
80.0000 ug | PREFILLED_SYRINGE | INTRAVENOUS | Status: DC | PRN
  Filled 2012-05-11: qty 2

## 2012-05-11 MED ORDER — OXYCODONE-ACETAMINOPHEN 5-325 MG PO TABS: 1.0000 | ORAL_TABLET | ORAL | Status: DC | PRN

## 2012-05-11 MED ORDER — EPHEDRINE 5 MG/ML INJ
10.0000 mg | INTRAVENOUS | Status: DC | PRN
  Filled 2012-05-11: qty 4
  Filled 2012-05-11: qty 2

## 2012-05-11 MED ORDER — FENTANYL 2.5 MCG/ML BUPIVACAINE 1/10 % EPIDURAL INFUSION (WH - ANES)
14.0000 mL/h | INTRAMUSCULAR | Status: DC | PRN
  Administered 2012-05-11: 14 mL/h via EPIDURAL
  Filled 2012-05-11: qty 125

## 2012-05-11 MED ORDER — OXYTOCIN 40 UNITS IN LACTATED RINGERS INFUSION - SIMPLE MED
1.0000 m[IU]/min | INTRAVENOUS | Status: DC
  Administered 2012-05-11: 2 m[IU]/min via INTRAVENOUS

## 2012-05-11 MED ORDER — EPHEDRINE 5 MG/ML INJ
10.0000 mg | INTRAVENOUS | Status: DC | PRN
  Filled 2012-05-11: qty 2

## 2012-05-11 MED ORDER — LACTATED RINGERS IV SOLN
500.0000 mL | Freq: Once | INTRAVENOUS | Status: AC
  Administered 2012-05-11: 500 mL via INTRAVENOUS

## 2012-05-11 MED ORDER — LACTATED RINGERS IV SOLN
500.0000 mL | INTRAVENOUS | Status: DC | PRN
  Administered 2012-05-11: 500 mL via INTRAVENOUS

## 2012-05-11 MED ORDER — OXYTOCIN 40 UNITS IN LACTATED RINGERS INFUSION - SIMPLE MED
62.5000 mL/h | INTRAVENOUS | Status: DC
  Filled 2012-05-11: qty 1000

## 2012-05-11 MED ORDER — LIDOCAINE HCL (PF) 1 % IJ SOLN
30.0000 mL | INTRAMUSCULAR | Status: DC | PRN
  Filled 2012-05-11 (×2): qty 30

## 2012-05-11 MED ORDER — IBUPROFEN 600 MG PO TABS: 600.0000 mg | ORAL_TABLET | Freq: Four times a day (QID) | ORAL | Status: DC | PRN

## 2012-05-11 NOTE — Progress Notes (Signed)
Kathryn Calderon is a 37 y.o. [id number] at [redacted]w[redacted]d by ultrasound admitted for induction of labor due to Anti E antibodies.  Subjective: Still not hurting very much  Objective: BP 133/70  Pulse 85  Temp(Src) 98.4 F (36.9 C)  Resp 16  Ht 5' 3.5" (1.613 m)  Wt 225 lb (102.059 kg)  BMI 39.23 kg/m2  LMP 09/26/2011      FHT:  FHR: 145 bpm, variability: moderate,  accelerations:  Present,  decelerations:  Absent UC:   regular, every 3 minutes SVE:   Dilation: 2.5 Effacement (%): 60 Station: Ballotable Exam by::  (williams, cnm, membranes striped by williams, cnm)  Labs: Lab Results  Component Value Date   WBC 10.2 05/11/2012   HGB 10.2* 05/11/2012   HCT 31.1* 05/11/2012   MCV 70.8* 05/11/2012   PLT 267 05/11/2012    Assessment / Plan: Induction of labor due to anti E antibodies,  progressing well on pitocin  Labor: Progressing on Pitocin, will continue to increase then AROM Preeclampsia:  n/a Fetal Wellbeing:  Category I Pain Control:  Labor support without medications but plans epidural when labor stronger I/D:  n/a Anticipated MOD:  NSVD  WILLIAMS,MARIE 05/11/2012, 7:01 PM

## 2012-05-11 NOTE — Anesthesia Preprocedure Evaluation (Signed)
Anesthesia Evaluation  Patient identified by MRN, date of birth, ID band Patient awake    Reviewed: Allergy & Precautions, H&P , Patient's Chart, lab work & pertinent test results  Airway Mallampati: III TM Distance: >3 FB Neck ROM: full    Dental no notable dental hx.    Pulmonary neg pulmonary ROS,  breath sounds clear to auscultation  Pulmonary exam normal       Cardiovascular negative cardio ROS  Rhythm:regular Rate:Normal     Neuro/Psych negative neurological ROS  negative psych ROS   GI/Hepatic negative GI ROS, Neg liver ROS,   Endo/Other  negative endocrine ROS  Renal/GU negative Renal ROS     Musculoskeletal   Abdominal   Peds  Hematology negative hematology ROS (+) anemia ,   Anesthesia Other Findings   Reproductive/Obstetrics (+) Pregnancy                           Anesthesia Physical Anesthesia Plan  ASA: II  Anesthesia Plan: Epidural   Post-op Pain Management:    Induction:   Airway Management Planned:   Additional Equipment:   Intra-op Plan:   Post-operative Plan:   Informed Consent: I have reviewed the patients History and Physical, chart, labs and discussed the procedure including the risks, benefits and alternatives for the proposed anesthesia with the patient or authorized representative who has indicated his/her understanding and acceptance.     Plan Discussed with:   Anesthesia Plan Comments:         Anesthesia Quick Evaluation

## 2012-05-11 NOTE — Anesthesia Procedure Notes (Signed)

## 2012-05-11 NOTE — H&P (Signed)
Kathryn Calderon is a [id number] at [redacted]w[redacted]d by ultrasound  37 y.o. female presenting for induction of labor.  Came in 730am started on PIT. States she has had no complications during prior pregnancies. Former smoker. Does not drink alcohol denies drug use.   Maternal Medical History:  Reason for admission: Nausea.  Fetal activity: Perceived fetal activity is normal.    Prenatal complications: No bleeding or hypertension.     OB History   Grav Para Term Preterm Abortions TAB SAB Ect Mult Living   11 10 10       9      Past Medical History  Diagnosis Date  . Sickle cell trait    Past Surgical History  Procedure Laterality Date  . Cholecystectomy  2000   Family History: family history includes Diabetes in her maternal grandmother and Hypertension in her maternal grandfather. Social History:  reports that she quit smoking about 5 years ago. Her smoking use included Cigarettes. She has a 1.25 pack-year smoking history. She has never used smokeless tobacco. She reports that  drinks alcohol. She reports that she does not use illicit drugs.   Prenatal Transfer Tool  Maternal Diabetes: No Genetic Screening: Too late for quad screen, genetics counseling due to  AMA  Maternal Ultrasounds/Referrals: Normal Fetal Ultrasounds or other Referrals:  Fetal echo Maternal Substance Abuse:  No Significant Maternal Medications:  Meds include: Other: Iron Significant Maternal Lab Results:  Anti -E antibody. Sickle trait  Other Comments:  None  Review of Systems  Constitutional: Negative for fever and chills.  Eyes: Negative for blurred vision, double vision and photophobia.  Respiratory: Negative.   Cardiovascular: Negative.   Gastrointestinal: Negative for heartburn, nausea, vomiting, abdominal pain, diarrhea and constipation.  Genitourinary: Negative.   Neurological: Negative for headaches.      Blood pressure 118/79, pulse 97, temperature 98.4 F (36.9 C), resp. rate 18, height 5' 3.5"  (1.613 m), weight 102.059 kg (225 lb), last menstrual period 09/26/2011. Maternal Exam:  Abdomen: Patient reports no abdominal tenderness.   Physical Exam  Constitutional: She is oriented to person, place, and time. She appears well-developed and well-nourished.  HENT:  Head: Normocephalic and atraumatic.  Eyes: EOM are normal.  Neck: Normal range of motion.  Cardiovascular: Normal rate, regular rhythm and normal heart sounds.  Exam reveals no gallop and no friction rub.   No murmur heard. Respiratory: Effort normal and breath sounds normal. No respiratory distress. She has no wheezes. She has no rales. She exhibits no tenderness.  Neurological: She is alert and oriented to person, place, and time. She has normal reflexes.  Skin: Skin is warm and dry.    Prenatal labs: ABO, Rh: --/--/A POS (04/15 0800) Antibody: PENDING (04/15 0800) Rubella: 2.73 (12/18 1053) RPR: NON REAC (01/23 1102)  HBsAg: NEGATIVE (12/18 1053)  HIV: NON REACTIVE (01/23 1102)  GBS: NEGATIVE (04/04 1645)   Assessment/Plan:  Kathryn Calderon is a 37 y.o. [id number] who presents for induction of labor.   Admitted to L and D  Planning on a epidural  Planning on BTL at later date  Plans on bottle feed.  Will go to [facility] for post natal care  Kathryn Calderon 05/11/2012, 9:10 AM  Seen and examined by me Agree with note Wynelle Bourgeois CNM

## 2012-05-11 NOTE — Progress Notes (Signed)
Kathryn Calderon is a 37 y.o. [id number] at [redacted]w[redacted]d by ultrasound admitted for induction of labor due to anti E antibodies.  Subjective: Starting to hurt a little more.  Objective: BP 132/75  Pulse 101  Temp(Src) 98.4 F (36.9 C)  Resp 18  Ht 5' 3.5" (1.613 m)  Wt 225 lb (102.059 kg)  BMI 39.23 kg/m2  LMP 09/26/2011      FHT:  FHR: 150 bpm, variability: minimal ,  accelerations:  Present,  decelerations:  Absent UC:   regular, every 2-3 minutes SVE:   Dilation: 4 Effacement (%): 60 Station: -3 Exam by:: Artelia Laroche CNM  Labs: Lab Results  Component Value Date   WBC 10.2 05/11/2012   HGB 10.2* 05/11/2012   HCT 31.1* 05/11/2012   MCV 70.8* 05/11/2012   PLT 267 05/11/2012    Assessment / Plan: Induction of labor due to anti E antibodies,  progressing well on pitocin  Labor: Progressing on Pitocin, will continue to increase then AROM  AROM for blood tinged fluid, anticipate increase in strength of contractions Preeclampsia:  n/a Fetal Wellbeing:  Category II Pain Control:  Labor support without medications I/D:  n/a Anticipated MOD:  NSVD  [facility] [address] 05/11/2012, 7:31 PM

## 2012-05-11 NOTE — Progress Notes (Signed)
Kathryn Calderon is a 37 y.o. [id number] at [redacted]w[redacted]d by ultrasound admitted for induction of labor due to Isoimmunization AntiE.  Subjective: Tolerating labor well  Objective: BP 119/68  Pulse 82  Temp(Src) 98.3 F (36.8 C)  Resp 16  Ht 5' 3.5" (1.613 m)  Wt 225 lb (102.059 kg)  BMI 39.23 kg/m2  LMP 09/26/2011      FHT:  FHR: 150 bpm, variability: moderate,  accelerations:  Present,  decelerations:  Absent UC:   regular, every 2 minutes SVE:   Dilation: 1.5 Effacement (%): 60 Station: -3 Exam by:: lee  Labs: Lab Results  Component Value Date   WBC 10.2 05/11/2012   HGB 10.2* 05/11/2012   HCT 31.1* 05/11/2012   MCV 70.8* 05/11/2012   PLT 267 05/11/2012    Assessment / Plan: Induction of labor due to Anti E,  progressing well on pitocin  Labor: Progressing on Pitocin, will continue to increase then AROM Preeclampsia:  n/a Fetal Wellbeing:  Category I Pain Control:  Labor support without medications I/D:  n/a Anticipated MOD:  NSVD  Kathryn Calderon 05/11/2012, 12:16 PM

## 2012-05-11 NOTE — Progress Notes (Signed)
Kathryn Calderon is a 37 y.o. [id number] at [redacted]w[redacted]d by ultrasound admitted for induction of labor due to anti E antibodies.  Subjective: Feeling pressure in her bottom. Has epidural.  Objective: BP 106/57  Pulse 118  Temp(Src) 97.9 F (36.6 C) (Oral)  Resp 18  Ht 5' 3.5" (1.613 m)  Wt 225 lb (102.059 kg)  BMI 39.23 kg/m2  SpO2 98%  LMP 09/26/2011   Total I/O In: -  Out: 400 [Urine:400]  FHT:  FHR: 140s bpm, variability: minimal ,  accelerations:  Present,  decelerations:  Present x1, unclear if early or late because toco not reading well, but good return to baseline UC:   regular, every 2-3 minutes SVE:   Dilation: 6 Effacement (%): 80;90 Station: -1 Exam by:: Dr Milana Na  Labs: Lab Results  Component Value Date   WBC 10.2 05/11/2012   HGB 10.2* 05/11/2012   HCT 31.1* 05/11/2012   MCV 70.8* 05/11/2012   PLT 267 05/11/2012    Assessment / Plan: Induction of labor due to anti E antibodies,  progressing well on pitocin  Labor: Progressing normally on pitocin  Preeclampsia:  n/a Fetal Wellbeing:  Category II Pain Control:  Epidural I/D:  n/a Anticipated MOD:  NSVD  Levert Feinstein 05/11/2012, 10:25 PM

## 2012-05-12 ENCOUNTER — Encounter (HOSPITAL_COMMUNITY): Payer: Self-pay

## 2012-05-12 DIAGNOSIS — O9902 Anemia complicating childbirth: Secondary | ICD-10-CM

## 2012-05-12 DIAGNOSIS — D573 Sickle-cell trait: Secondary | ICD-10-CM

## 2012-05-12 DIAGNOSIS — O361190 Maternal care for Anti-A sensitization, unspecified trimester, not applicable or unspecified: Secondary | ICD-10-CM

## 2012-05-12 LAB — CBC
HCT: 26.8 % — ABNORMAL LOW (ref 36.0–46.0)
MCHC: 32.8 g/dL (ref 30.0–36.0)
RDW: 20.5 % — ABNORMAL HIGH (ref 11.5–15.5)

## 2012-05-12 MED ORDER — DIPHENHYDRAMINE HCL 25 MG PO CAPS: 25.0000 mg | ORAL_CAPSULE | Freq: Four times a day (QID) | ORAL | Status: DC | PRN

## 2012-05-12 MED ORDER — PRENATAL MULTIVITAMIN CH
1.0000 | ORAL_TABLET | Freq: Every day | ORAL | Status: DC
  Administered 2012-05-12 – 2012-05-14 (×3): 1 via ORAL
  Filled 2012-05-12 (×3): qty 1

## 2012-05-12 MED ORDER — OXYCODONE-ACETAMINOPHEN 5-325 MG PO TABS
1.0000 | ORAL_TABLET | ORAL | Status: DC | PRN
  Administered 2012-05-12 – 2012-05-13 (×3): 1 via ORAL
  Filled 2012-05-12 (×3): qty 1

## 2012-05-12 MED ORDER — LACTATED RINGERS IV SOLN: INTRAVENOUS | Status: DC

## 2012-05-12 MED ORDER — ZOLPIDEM TARTRATE 5 MG PO TABS: 5.0000 mg | ORAL_TABLET | Freq: Every evening | ORAL | Status: DC | PRN

## 2012-05-12 MED ORDER — LANOLIN HYDROUS EX OINT: TOPICAL_OINTMENT | CUTANEOUS | Status: DC | PRN

## 2012-05-12 MED ORDER — TETANUS-DIPHTH-ACELL PERTUSSIS 5-2.5-18.5 LF-MCG/0.5 IM SUSP: 0.5000 mL | Freq: Once | INTRAMUSCULAR | Status: DC

## 2012-05-12 MED ORDER — METOCLOPRAMIDE HCL 10 MG PO TABS: 10.0000 mg | ORAL_TABLET | Freq: Once | ORAL | Status: DC

## 2012-05-12 MED ORDER — MISOPROSTOL 200 MCG PO TABS: 1000.0000 ug | ORAL_TABLET | Freq: Once | ORAL | Status: AC

## 2012-05-12 MED ORDER — BENZOCAINE-MENTHOL 20-0.5 % EX AERO: 1.0000 "application " | INHALATION_SPRAY | CUTANEOUS | Status: DC | PRN

## 2012-05-12 MED ORDER — ONDANSETRON HCL 4 MG/2ML IJ SOLN: 4.0000 mg | INTRAMUSCULAR | Status: DC | PRN

## 2012-05-12 MED ORDER — SIMETHICONE 80 MG PO CHEW: 80.0000 mg | CHEWABLE_TABLET | ORAL | Status: DC | PRN

## 2012-05-12 MED ORDER — ONDANSETRON HCL 4 MG PO TABS: 4.0000 mg | ORAL_TABLET | ORAL | Status: DC | PRN

## 2012-05-12 MED ORDER — WITCH HAZEL-GLYCERIN EX PADS
1.0000 "application " | MEDICATED_PAD | CUTANEOUS | Status: DC | PRN
  Administered 2012-05-12: 1 via TOPICAL

## 2012-05-12 MED ORDER — DIBUCAINE 1 % RE OINT
1.0000 "application " | TOPICAL_OINTMENT | RECTAL | Status: DC | PRN
  Administered 2012-05-12: 1 via RECTAL
  Filled 2012-05-12: qty 28

## 2012-05-12 MED ORDER — SENNOSIDES-DOCUSATE SODIUM 8.6-50 MG PO TABS
2.0000 | ORAL_TABLET | Freq: Every day | ORAL | Status: DC
  Administered 2012-05-12 – 2012-05-13 (×2): 2 via ORAL

## 2012-05-12 MED ORDER — FAMOTIDINE 20 MG PO TABS: 40.0000 mg | ORAL_TABLET | Freq: Once | ORAL | Status: DC

## 2012-05-12 MED ORDER — PNEUMOCOCCAL VAC POLYVALENT 25 MCG/0.5ML IJ INJ
0.5000 mL | INJECTION | INTRAMUSCULAR | Status: DC
  Filled 2012-05-12: qty 0.5

## 2012-05-12 MED ORDER — IBUPROFEN 600 MG PO TABS
600.0000 mg | ORAL_TABLET | Freq: Four times a day (QID) | ORAL | Status: DC
  Administered 2012-05-12 – 2012-05-14 (×10): 600 mg via ORAL
  Filled 2012-05-12 (×10): qty 1

## 2012-05-12 NOTE — Progress Notes (Signed)
Kathryn Calderon is a 37 y.o. [id number] at [redacted]w[redacted]d by ultrasound admitted for induction of labor due to anti E antibodies.  Subjective: Continues to feel pressure in her bottom. Has epidural. Denies HA or vision changes.  Objective: BP 135/81  Pulse 102  Temp(Src) 98.1 F (36.7 C) (Oral)  Resp 18  Ht 5' 3.5" (1.613 m)  Wt 225 lb (102.059 kg)  BMI 39.23 kg/m2  SpO2 98%  LMP 09/26/2011   Total I/O In: -  Out: 550 [Urine:550]  FHT:  FHR: 140s bpm, variability: minimal to moderate,  accelerations:  Present,  decelerations:  Present frequent early decels UC:   regular, every 2-3 minutes SVE:   Dilation: 7.5 Effacement (%): 80;90 Station: 0;-1 Exam by:: [employer] RN  Labs: Lab Results  Component Value Date   WBC 10.2 05/11/2012   HGB 10.2* 05/11/2012   HCT 31.1* 05/11/2012   MCV 70.8* 05/11/2012   PLT 267 05/11/2012    Assessment / Plan: Induction of labor due to anti E antibodies,  progressing well on pitocin  Labor: Progressing normally on pitocin  Preeclampsia:  n/a Fetal Wellbeing:  Category II Pain Control:  Epidural I/D:  GBS negative Anticipated MOD:  NSVD  Levert Feinstein 05/12/2012, 12:49 AM

## 2012-05-12 NOTE — Anesthesia Postprocedure Evaluation (Signed)
  Anesthesia Post-op Note  Patient: Kathryn Calderon  Procedure(s) Performed: * No procedures listed *  Patient Location: PACU and Mother/Baby  Anesthesia Type:Epidural  Level of Consciousness: awake  Airway and Oxygen Therapy: Patient Spontanous Breathing  Post-op Pain: none  Post-op Assessment: Patient's Cardiovascular Status Stable, Respiratory Function Stable, Patent Airway, No signs of Nausea or vomiting, Adequate PO intake, Pain level controlled, No headache, No backache, No residual numbness and No residual motor weakness  Post-op Vital Signs: Reviewed and stable  Complications: No apparent anesthesia complications

## 2012-05-12 NOTE — Progress Notes (Signed)
C. Williams,CNM notified pt does not want a BTL in AM.

## 2012-05-12 NOTE — Progress Notes (Signed)
One pad noted in pt trash red and heavy only pad changed on MBU. Pt refused fundal check stating that the pad also had urine in it. RN will return at 1300 for full assessmant

## 2012-05-13 MED ORDER — FERROUS SULFATE 325 (65 FE) MG PO TABS
325.0000 mg | ORAL_TABLET | Freq: Two times a day (BID) | ORAL | Status: DC
  Administered 2012-05-13 – 2012-05-14 (×3): 325 mg via ORAL
  Filled 2012-05-13 (×3): qty 1

## 2012-05-13 NOTE — Progress Notes (Signed)
Post Partum Day 1 Subjective: up ad lib, voiding, tolerating PO, + flatus and having some cramping Says her legs are sore, she thinks from the bed  Objective: Blood pressure 129/76, pulse 64, temperature 98.8 F (37.1 C), temperature source Oral, resp. rate 20, height 5' 3.5" (1.613 m), weight 225 lb (102.059 kg), last menstrual period 09/26/2011, SpO2 98.00%, unknown if currently breastfeeding.  Physical Exam:  General: alert, cooperative and no distress Lochia: appropriate Uterine Fundus: firm Incision: n/a DVT Evaluation: No evidence of DVT seen on physical exam. Negative Homan's sign. No cords or calf tenderness.   Recent Labs  05/11/12 0800 05/12/12 0630  HGB 10.2* 8.8*  HCT 31.1* 26.8*    Assessment/Plan: Plan for discharge tomorrow (baby has to be observed for 48 hours after delivery because of the anti-E isoimmunization) Bottle feeding Interval BTL - plans to use abstinence until then   LOS: 2 days   Kathryn Calderon 05/13/2012, 6:53 AM

## 2012-05-13 NOTE — H&P (Signed)
Attestation of Attending Supervision of Advanced Practitioner (CNM/NP): Evaluation and management procedures were performed by the Advanced Practitioner under my supervision and collaboration.  I have reviewed the Advanced Practitioner's note and chart, and I agree with the management and plan.  HARRAWAY-SMITH, Ulyana Pitones 4:18 PM

## 2012-05-13 NOTE — Progress Notes (Signed)
I spoke with and examined patient and agree with resident's note and plan of care.  Bilateral leg soreness from thighs to calves, 'from bed or pushing'.  No s/s DVT Bottlefeeding, interim BTL, OP circumcision Will add Fe supplement  Cheral Marker, CNM, WHNP-BC 05/13/2012 7:06 AM

## 2012-05-14 LAB — TYPE AND SCREEN
PT AG Type: NEGATIVE
Unit division: 0

## 2012-05-14 MED ORDER — IBUPROFEN 600 MG PO TABS: 600.0000 mg | ORAL_TABLET | Freq: Four times a day (QID) | ORAL | Status: DC

## 2012-05-14 MED ORDER — CYCLOBENZAPRINE HCL 10 MG PO TABS: 10.0000 mg | ORAL_TABLET | Freq: Three times a day (TID) | ORAL | Status: DC | PRN

## 2012-05-14 NOTE — Discharge Summary (Signed)
Obstetric Discharge Summary Reason for Admission: induction of labor Prenatal Procedures: ultrasound and Fetal ECHO for Hx of Congenital Heart disease; Anti-E isoimmunization Intrapartum Procedures: spontaneous vaginal delivery Postpartum Procedures: none Complications-Operative and Postpartum: none and hemorrhage Hemoglobin  Date Value Range Status  05/12/2012 8.8* 12.0 - 15.0 g/dL Final     HCT  Date Value Range Status  05/12/2012 26.8* 36.0 - 46.0 % Final    Physical Exam:  General: alert, cooperative, appears stated age, no distress and moderately obese Lochia: appropriate Uterine Fundus: firm Incision:  DVT Evaluation: No evidence of DVT seen on physical exam. Negative Homan's sign. No cords or calf tenderness. No significant calf/ankle edema.  Discharge Diagnoses:  1. Term Pregnancy Delivered 2. Anti-E Isoimmunization 3. PP Hemorrhage  Discharge Information: Date: 05/14/2012 Activity: pelvic rest Diet: routine Medications: Ibuprofen & Fe Condition: stable Instructions: refer to practice specific booklet Discharge to: home   Newborn Data: Live born female  Birth Weight: 8 lb 12.4 oz (3980 g) APGAR: 8, 9  Home with mother.  Andrena Mews, DO [facility] Resident - PGY-2 05/14/2012 8:02 AM I have seen and examined this patient and agree the above assessment. CRESENZO-DISHMAN,Dillinger Aston 05/14/2012 8:45 AM

## 2012-05-17 ENCOUNTER — Encounter: Payer: Medicaid Other | Admitting: Family Medicine

## 2012-08-20 ENCOUNTER — Encounter: Payer: Self-pay | Admitting: Family Medicine

## 2012-08-23 ENCOUNTER — Emergency Department (HOSPITAL_COMMUNITY)
Admission: EM | Admit: 2012-08-23 | Discharge: 2012-08-23 | Disposition: A | Discharge status: Home / Self Care | Payer: Medicaid Other | Attending: Emergency Medicine | Admitting: Emergency Medicine

## 2012-08-23 ENCOUNTER — Encounter (HOSPITAL_COMMUNITY): Payer: Self-pay | Admitting: Emergency Medicine

## 2012-08-23 DIAGNOSIS — M5432 Sciatica, left side: Secondary | ICD-10-CM

## 2012-08-23 DIAGNOSIS — M5430 Sciatica, unspecified side: Secondary | ICD-10-CM | POA: Insufficient documentation

## 2012-08-23 DIAGNOSIS — Z862 Personal history of diseases of the blood and blood-forming organs and certain disorders involving the immune mechanism: Secondary | ICD-10-CM | POA: Insufficient documentation

## 2012-08-23 DIAGNOSIS — M79609 Pain in unspecified limb: Secondary | ICD-10-CM | POA: Insufficient documentation

## 2012-08-23 DIAGNOSIS — Z87891 Personal history of nicotine dependence: Secondary | ICD-10-CM | POA: Insufficient documentation

## 2012-08-23 DIAGNOSIS — Z79899 Other long term (current) drug therapy: Secondary | ICD-10-CM | POA: Insufficient documentation

## 2012-08-23 DIAGNOSIS — Z880 Allergy status to penicillin: Secondary | ICD-10-CM | POA: Insufficient documentation

## 2012-08-23 MED ORDER — KETOROLAC TROMETHAMINE 60 MG/2ML IM SOLN
60.0000 mg | Freq: Once | INTRAMUSCULAR | Status: AC
  Administered 2012-08-23: 60 mg via INTRAMUSCULAR
  Filled 2012-08-23: qty 2

## 2012-08-23 MED ORDER — PREDNISONE 20 MG PO TABS: 40.0000 mg | ORAL_TABLET | Freq: Every day | ORAL | Status: DC

## 2012-08-23 NOTE — ED Notes (Signed)
Pt has been havinglt lower back / buttocks pain afor a while and it radates to lt leg. Denies any injury, no urinary sx.

## 2012-08-23 NOTE — ED Provider Notes (Signed)
CSN: [id number]     Arrival date & time 08/23/12  1825 History    This chart was scribed for Roxy Horseman, non-physician practitioner working with Laray Anger, DO by Leone Payor, ED Scribe. This patient was seen in room WTR5/WTR5 and the patient's care was started at 1825.   First MD Initiated Contact with Patient 08/23/12 1858     Chief Complaint  Patient presents with  . Leg Pain  . Tailbone Pain    The history is provided by the patient. No language interpreter was used.    HPI Comments: Kathryn Calderon is a 37 y.o. female who presents to the Emergency Department complaining moderate, persistent, lower back pain radiating to the left leg that started initially several months ago. States she has episodes that come and go periodically but today the pain has worsened. She denies dysuria, problems ambulating. Nothing makes his symptoms better or worse.   Past Medical History  Diagnosis Date  . Sickle cell trait    Past Surgical History  Procedure Laterality Date  . Cholecystectomy  2000   Family History  Problem Relation Age of Onset  . Diabetes Maternal Grandmother   . Hypertension Maternal Grandfather    History  Substance Use Topics  . Smoking status: Former Smoker -- 0.25 packs/day for 5 years    Types: Cigarettes    Quit date: 09/10/2006  . Smokeless tobacco: Never Used  . Alcohol Use: Yes     Comment: occasional    OB History   Grav Para Term Preterm Abortions TAB SAB Ect Mult Living   11 11 11       10      Review of Systems  Genitourinary: Negative for dysuria.  Musculoskeletal: Positive for back pain. Negative for gait problem.  All other systems reviewed and are negative.    Allergies  Penicillins  Home Medications   Current Outpatient Rx  Name  Route  Sig  Dispense  Refill  . cyclobenzaprine (FLEXERIL) 10 MG tablet   Oral   Take 1 tablet (10 mg total) by mouth 3 (three) times daily as needed for muscle spasms.   30 tablet   0   .  ferrous sulfate 325 (65 FE) MG tablet   Oral   Take 1 tablet (325 mg total) by mouth 3 (three) times daily with meals.   90 tablet   5   . ibuprofen (ADVIL,MOTRIN) 600 MG tablet   Oral   Take 1 tablet (600 mg total) by mouth every 6 (six) hours.   30 tablet   0   . Prenatal Vit-Fe Fumarate-FA (PRENATAL MULTIVITAMIN) TABS   Oral   Take 1 tablet by mouth daily at 12 noon.          BP 127/76  Pulse 88  Temp(Src) 97.8 F (36.6 C)  Resp 20  SpO2 100% Physical Exam  Nursing note and vitals reviewed. Constitutional: She is oriented to person, place, and time. She appears well-developed and well-nourished.  HENT:  Head: Normocephalic and atraumatic.  Eyes: Conjunctivae and EOM are normal. Pupils are equal, round, and reactive to light.  Neck: Normal range of motion. Neck supple.  Cardiovascular: Normal rate, regular rhythm and normal heart sounds.   Pulmonary/Chest: Effort normal and breath sounds normal.  Abdominal: Soft. Bowel sounds are normal.  Musculoskeletal: Normal range of motion.  CTLS spine without step offs, bony tenderness, or abnormalities. Left sided L paraspinal muscles are tender to palpation. Pt moves all extremities well  and is ambulatory.    Neurological: She is alert and oriented to person, place, and time.  Sensation and strength intact.   Skin: Skin is warm and dry.  Psychiatric: She has a normal mood and affect.    ED Course   Procedures (including critical care time)  DIAGNOSTIC STUDIES: Oxygen Saturation is 100% on RA, normal by my interpretation.    COORDINATION OF CARE: 7:17 PM Discussed treatment plan with pt at bedside and pt agreed to plan.   Labs Reviewed - No data to display No results found. 1. Sciatica, left     MDM  Patient sciatica. Will treat with prednisone. Recommend followup with PCP. Patient is stable and ready for discharge.  I personally performed the services described in this documentation, which was scribed in my  presence. The recorded information has been reviewed and is accurate.    Roxy Horseman, PA-C 08/23/12 2350

## 2012-08-25 NOTE — ED Provider Notes (Signed)
Medical screening examination/treatment/procedure(s) were performed by non-physician practitioner and as supervising physician I was immediately available for consultation/collaboration.   Brazen Domangue M Yuvia Plant, DO 08/25/12 1751 

## 2012-11-04 ENCOUNTER — Encounter: Payer: Self-pay | Admitting: Family Medicine

## 2013-07-31 ENCOUNTER — Inpatient Hospital Stay (HOSPITAL_COMMUNITY)
Admission: AD | Admit: 2013-07-31 | Discharge: 2013-07-31 | Disposition: A | Discharge status: Home / Self Care | Payer: Medicaid Other | Source: Ambulatory Visit | Attending: Obstetrics and Gynecology | Admitting: Obstetrics and Gynecology

## 2013-07-31 ENCOUNTER — Encounter (HOSPITAL_COMMUNITY): Payer: Self-pay | Admitting: General Practice

## 2013-07-31 DIAGNOSIS — Z87891 Personal history of nicotine dependence: Secondary | ICD-10-CM | POA: Diagnosis not present

## 2013-07-31 DIAGNOSIS — M545 Low back pain, unspecified: Secondary | ICD-10-CM | POA: Insufficient documentation

## 2013-07-31 DIAGNOSIS — N63 Unspecified lump in unspecified breast: Secondary | ICD-10-CM | POA: Diagnosis not present

## 2013-07-31 DIAGNOSIS — N6320 Unspecified lump in the left breast, unspecified quadrant: Secondary | ICD-10-CM

## 2013-07-31 DIAGNOSIS — Z833 Family history of diabetes mellitus: Secondary | ICD-10-CM | POA: Insufficient documentation

## 2013-07-31 LAB — URINALYSIS, ROUTINE W REFLEX MICROSCOPIC
BILIRUBIN URINE: NEGATIVE
Glucose, UA: NEGATIVE mg/dL
Ketones, ur: NEGATIVE mg/dL
Leukocytes, UA: NEGATIVE
NITRITE: NEGATIVE
Protein, ur: NEGATIVE mg/dL
SPECIFIC GRAVITY, URINE: 1.015 (ref 1.005–1.030)
UROBILINOGEN UA: 0.2 mg/dL (ref 0.0–1.0)
pH: 6 (ref 5.0–8.0)

## 2013-07-31 LAB — URINE MICROSCOPIC-ADD ON

## 2013-07-31 LAB — POCT PREGNANCY, URINE: Preg Test, Ur: NEGATIVE

## 2013-07-31 NOTE — Discharge Instructions (Signed)

## 2013-07-31 NOTE — MAU Provider Note (Signed)
History     CSN: [id number]  Arrival date and time: 07/31/13 1800   First Provider Initiated Contact with Patient 07/31/13 1943      Chief Complaint  Patient presents with  . Urinary Tract Infection  . knot in left breast    Urinary Tract Infection     Kathryn Calderon is a 38 y.o. [id number] who presents today with lower back pain and a lump on her left breast. She states that she has had the lower back pain for "a couple of days", and she has not taken anything for it at this time. She points the area directly over her sacrum as to where the pain is located. She denies any fever, chills nausea or vomiting. She states that her urine is dark and "smells bad".  She also has a lump on her left breast that she has noticed for about 2 months. She has not called her PCP [facility] about this.  Past Medical History  Diagnosis Date  . Sickle cell trait     Past Surgical History  Procedure Laterality Date  . Cholecystectomy  2000    Family History  Problem Relation Age of Onset  . Diabetes Maternal Grandmother   . Hypertension Maternal Grandfather     History  Substance Use Topics  . Smoking status: Former Smoker -- 0.25 packs/day for 5 years    Types: Cigarettes    Quit date: 09/10/2006  . Smokeless tobacco: Never Used  . Alcohol Use: Yes     Comment: occasional     Allergies:  Allergies  Allergen Reactions  . Penicillins Rash    No prescriptions prior to admission    ROS Physical Exam   Blood pressure 141/62, pulse 105, temperature 98.7 F (37.1 C), resp. rate 16, height 5\' 2"  (1.575 m), weight 104.781 kg (231 lb), last menstrual period 07/29/2013, unknown if currently breastfeeding.  Physical Exam  Nursing note and vitals reviewed. Constitutional: She is oriented to person, place, and time. She appears well-developed and well-nourished. No distress.  Cardiovascular: Normal rate.   Respiratory: Effort normal.  GI: Soft. There is no tenderness.  There is no rebound.  Genitourinary:  No CVA tenderness   Neurological: She is alert and oriented to person, place, and time.  Skin: Skin is warm and dry.  Psychiatric: She has a normal mood and affect.    MAU Course  Procedures  Results for orders placed during the hospital encounter of 07/31/13 (from the past 24 hour(s))  URINALYSIS, ROUTINE W REFLEX MICROSCOPIC     Status: Abnormal   Collection Time    07/31/13  6:20 PM      Result Value Ref Range   Color, Urine YELLOW  YELLOW   APPearance CLEAR  CLEAR   Specific Gravity, Urine 1.015  1.005 - 1.030   pH 6.0  5.0 - 8.0   Glucose, UA NEGATIVE  NEGATIVE mg/dL   Hgb urine dipstick TRACE (*) NEGATIVE   Bilirubin Urine NEGATIVE  NEGATIVE   Ketones, ur NEGATIVE  NEGATIVE mg/dL   Protein, ur NEGATIVE  NEGATIVE mg/dL   Urobilinogen, UA 0.2  0.0 - 1.0 mg/dL   Nitrite NEGATIVE  NEGATIVE   Leukocytes, UA NEGATIVE  NEGATIVE  URINE MICROSCOPIC-ADD ON     Status: Abnormal   Collection Time    07/31/13  6:20 PM      Result Value Ref Range   Squamous Epithelial / LPF FEW (*) RARE   WBC, UA 0-2  <  3 WBC/hpf   Bacteria, UA MANY (*) RARE  POCT PREGNANCY, URINE     Status: None   Collection Time    07/31/13  6:27 PM      Result Value Ref Range   Preg Test, Ur NEGATIVE  NEGATIVE     Assessment and Plan   1. Midline low back pain without sciatica   2. Left breast lump    Referral for mammogram at the breast center Discussed comfort measures for LBP Urine culture pending Return to MAU as needed  Follow-up Information   Call [facility].   Contact information:   [address] [place] [id number]        Tawnya CrookHogan, Hodges Treiber Donovan 07/31/2013, 7:55 PM

## 2013-07-31 NOTE — MAU Provider Note (Signed)
Attestation of Attending Supervision of Advanced Practitioner (CNM/NP): Evaluation and management procedures were performed by the Advanced Practitioner under my supervision and collaboration.  I have reviewed the Advanced Practitioner's note and chart, and I agree with the management and plan.  Annaliah Rivenbark 07/31/2013 8:17 PM

## 2013-07-31 NOTE — MAU Note (Signed)
Pt presents to MAU with complaints of pain in her lower back, strong odor to her urine and she reports she noticed a small knot in her left breast a couple months ago.

## 2013-08-03 LAB — URINE CULTURE

## 2013-08-04 ENCOUNTER — Other Ambulatory Visit: Payer: Self-pay | Admitting: Advanced Practice Midwife

## 2013-08-04 ENCOUNTER — Other Ambulatory Visit (HOSPITAL_COMMUNITY): Payer: Self-pay | Admitting: Advanced Practice Midwife

## 2013-08-04 ENCOUNTER — Other Ambulatory Visit (HOSPITAL_COMMUNITY): Payer: Self-pay | Admitting: Gynecology

## 2013-08-04 DIAGNOSIS — N6320 Unspecified lump in the left breast, unspecified quadrant: Secondary | ICD-10-CM

## 2013-08-04 MED ORDER — NITROFURANTOIN MONOHYD MACRO 100 MG PO CAPS: 100.0000 mg | ORAL_CAPSULE | Freq: Two times a day (BID) | ORAL | Status: DC

## 2013-08-04 NOTE — MAU Provider Note (Signed)
Pt notified of positive urine culture >100,000 colonies of E.Coli Rx for Macrobid 100mg  BID for 7 days sent to pharmacy

## 2013-08-11 ENCOUNTER — Other Ambulatory Visit: Payer: Medicaid Other

## 2013-08-23 ENCOUNTER — Other Ambulatory Visit: Payer: Medicaid Other

## 2013-08-24 ENCOUNTER — Ambulatory Visit
Admission: RE | Admit: 2013-08-24 | Discharge: 2013-08-24 | Disposition: A | Discharge status: Home / Self Care | Payer: Medicaid Other | Source: Ambulatory Visit | Attending: Advanced Practice Midwife | Admitting: Advanced Practice Midwife

## 2013-08-24 DIAGNOSIS — N6320 Unspecified lump in the left breast, unspecified quadrant: Secondary | ICD-10-CM

## 2013-10-12 ENCOUNTER — Encounter (HOSPITAL_COMMUNITY): Payer: Self-pay | Admitting: Emergency Medicine

## 2013-10-12 ENCOUNTER — Emergency Department (HOSPITAL_COMMUNITY)
Admission: EM | Admit: 2013-10-12 | Discharge: 2013-10-12 | Disposition: A | Discharge status: Home / Self Care | Payer: Medicaid Other | Attending: Emergency Medicine | Admitting: Emergency Medicine

## 2013-10-12 DIAGNOSIS — J029 Acute pharyngitis, unspecified: Secondary | ICD-10-CM | POA: Diagnosis present

## 2013-10-12 DIAGNOSIS — Z862 Personal history of diseases of the blood and blood-forming organs and certain disorders involving the immune mechanism: Secondary | ICD-10-CM | POA: Diagnosis not present

## 2013-10-12 DIAGNOSIS — IMO0001 Reserved for inherently not codable concepts without codable children: Secondary | ICD-10-CM | POA: Diagnosis not present

## 2013-10-12 DIAGNOSIS — R51 Headache: Secondary | ICD-10-CM | POA: Diagnosis not present

## 2013-10-12 DIAGNOSIS — J069 Acute upper respiratory infection, unspecified: Secondary | ICD-10-CM

## 2013-10-12 DIAGNOSIS — Z792 Long term (current) use of antibiotics: Secondary | ICD-10-CM | POA: Insufficient documentation

## 2013-10-12 DIAGNOSIS — Z87891 Personal history of nicotine dependence: Secondary | ICD-10-CM | POA: Diagnosis not present

## 2013-10-12 DIAGNOSIS — Z880 Allergy status to penicillin: Secondary | ICD-10-CM | POA: Insufficient documentation

## 2013-10-12 LAB — RAPID STREP SCREEN (MED CTR MEBANE ONLY): Streptococcus, Group A Screen (Direct): NEGATIVE

## 2013-10-12 NOTE — Discharge Instructions (Signed)
Your strep screen was negative. You do not need a chest x-ray today based on your symptoms. If you symptoms continue, please follow up with your primary care doctor for further evaluation and x-ray if they feel it is appropriate. Continue over the counter medicatrions.   Upper Respiratory Infection, Adult An upper respiratory infection (URI) is also known as the common cold. It is often caused by a type of germ (virus). Colds are easily spread (contagious). You can pass it to others by kissing, coughing, sneezing, or drinking out of the same glass. Usually, you get better in 1 or 2 weeks.  HOME CARE   Only take medicine as told by your doctor.  Use a warm mist humidifier or breathe in steam from a hot shower.  Drink enough water and fluids to keep your pee (urine) clear or pale yellow.  Get plenty of rest.  Return to work when your temperature is back to normal or as told by your doctor. You may use a face mask and wash your hands to stop your cold from spreading. GET HELP RIGHT AWAY IF:   After the first few days, you feel you are getting worse.  You have questions about your medicine.  You have chills, shortness of breath, or brown or red spit (mucus).  You have yellow or brown snot (nasal discharge) or pain in the face, especially when you bend forward.  You have a fever, puffy (swollen) neck, pain when you swallow, or white spots in the back of your throat.  You have a bad headache, ear pain, sinus pain, or chest pain.  You have a high-pitched whistling sound when you breathe in and out (wheezing).  You have a lasting cough or cough up blood.  You have sore muscles or a stiff neck. MAKE SURE YOU:   Understand these instructions.  Will watch your condition.  Will get help right away if you are not doing well or get worse. Document Released: 07/02/2007 Document Revised: 04/07/2011 Document Reviewed: 04/20/2013 [facility] Patient Information 2015 [place], [place]. This  information is not intended to replace advice given to you by your health care provider. Make sure you discuss any questions you have with your health care provider.

## 2013-10-12 NOTE — ED Provider Notes (Signed)
CSN: [id number]     Arrival date & time 10/12/13  1834 History   First MD Initiated Contact with Patient 10/12/13 2018   This chart was scribed for non-physician practitioner Jaynie Crumble, PA-C,  working with Rolland Porter, MD by Gwenevere Abbot, ED scribe. This patient was seen in room TR07C/TR07C and the patient's care was started at 8:47 PM.    Chief Complaint  Patient presents with  . Sore Throat  . Headache   Patient is a 38 y.o. female presenting with headaches. The history is provided by the patient. No language interpreter was used.  Headache Associated symptoms: congestion, cough, fever, myalgias and sore throat    HPI Comments:  Kathryn Calderon is a 38 y.o. female who presents to the Emergency Department complaining of sore throat with associated symptoms of fever, headache, generalized body aches and dry cough, onset 2 days ago. Pt reports a fever of 102, but pt has not had a fever in two days. Pt has taken tylenol and motrin, with mild relief of symptoms. Pt denies urinary symptoms. Pt denies seasonal allergies. Pt reports that she is otherwise healthy. Pt reports that her children recently had pneumonia and strep.   Past Medical History  Diagnosis Date  . Sickle cell trait    Past Surgical History  Procedure Laterality Date  . Cholecystectomy  2000   Family History  Problem Relation Age of Onset  . Diabetes Maternal Grandmother   . Hypertension Maternal Grandfather    History  Substance Use Topics  . Smoking status: Former Smoker -- 0.25 packs/day for 5 years    Types: Cigarettes    Quit date: 09/10/2006  . Smokeless tobacco: Never Used  . Alcohol Use: Yes     Comment: occasional    OB History   Grav Para Term Preterm Abortions TAB SAB Ect Mult Living   Review of Systems  Constitutional: Positive for fever and chills.  HENT: Positive for congestion and sore throat.   Respiratory: Positive for cough.   Musculoskeletal: Positive for  arthralgias and myalgias.  Neurological: Positive for headaches.      Allergies  Penicillins  Home Medications   Prior to Admission medications   Medication Sig Start Date End Date Taking? Authorizing Provider  nitrofurantoin, macrocrystal-monohydrate, (MACROBID) 100 MG capsule Take 1 capsule (100 mg total) by mouth 2 (two) times daily. 08/04/13   Aviva Signs, CNM   BP 114/69  Pulse 89  Temp(Src) 98.7 F (37.1 C) (Oral)  Resp 18  SpO2 99%  LMP 09/13/2013 Physical Exam  Nursing note and vitals reviewed. Constitutional: She is oriented to person, place, and time. She appears well-developed and well-nourished.  HENT:  Head: Normocephalic and atraumatic.  Right Ear: Tympanic membrane, external ear and ear canal normal.  Left Ear: Tympanic membrane, external ear and ear canal normal.  Nose: Mucosal edema and rhinorrhea present.  Mouth/Throat: Uvula is midline and oropharynx is clear and moist. No oropharyngeal exudate, posterior oropharyngeal edema or posterior oropharyngeal erythema.  Post nasal drainage  Eyes: Conjunctivae and EOM are normal.  Neck: Normal range of motion. Neck supple.  Cardiovascular: Normal rate and normal heart sounds.   Pulmonary/Chest: Effort normal and breath sounds normal. No respiratory distress. She has no wheezes. She has no rales.  Musculoskeletal: Normal range of motion.  Neurological: She is alert and oriented to person, place, and time.  Skin: Skin is warm and  dry.  Psychiatric: She has a normal mood and affect. Her behavior is normal.    ED Course  Procedures  DIAGNOSTIC STUDIES: Oxygen Saturation is 99% on RA, normal by my interpretation.  COORDINATION OF CARE: 8:47 PM-Discussed treatment plan which includes strep swab and CXR.  Labs Review Labs Reviewed - No data to display  Imaging Review No results found.   EKG Interpretation None      MDM   Final diagnoses:  URI (upper respiratory infection)    Pt with uri  symptoms, nasal congestion, sore throat, cough for 2 days. Afebrile, VS all within normal. Pt not coughing in ED. Lungs clear. Do not think pt needs further imaging. Pt because angry when i told her I thought she had a viral URI, and requested cxr saying 3 of her children had recently been diagnosed with pneumonia. i did place an order for xray.   Delay in obtaining xray. Pt became irritated. Stated she also wanted tests done for her back and her headache. She denies any urinary symptoms. Back pain bilateral, lower. No neuro deficits. Explained may be due to her coughing, CXR pending.   Pt became angry again, asking why i suggested she had allergies, when I only asked her if she did have allergies when I was in the room. This was heard by both my PA student and scribe. Pt again asking why her back and her head was hurting. I again told her that I thought it was from coughing and her cold. Pt stated to me, "well I dont think so, you are wrong." I told her that if she did not want to listen to my explanations, and did not trust me as a provider, then there is nothing i could do for her, and she needed to follow up with her doctor. Pt laughing and screaming in the room with her significant other and her child. Closed her door. Still waiting on CXR. Pt walked out of the room saying that I did not need to close her door. Pt asked to speak to charge nurse. Charge nurse with [facility] at this time.  Explained to her that she may have to wait to see them. I told her that she was welcome to leave, and I did not think she needed chest x-ray if she did not want to wait on it, clinically NO evidence of pneumonia. Strep screen negative. I printed pt's papers for d/c however she walked out before receiving papers. Walked out with no difficulty.   Filed Vitals:   10/12/13 1857  BP: 114/69  Pulse: 89  Temp: 98.7 F (37.1 C)  TempSrc: Oral  Resp: 18  SpO2: 99%    I personally performed the services described in this  documentation, which was scribed in my presence. The recorded information has been reviewed and is accurate.    Lottie Mussel, PA-C 10/12/13 2205

## 2013-10-12 NOTE — ED Notes (Signed)
Pt reports that her kids recently  Had strep and pneumonia. Pt is now having sore throat, fever, headache, bodyaches. No acute distress noted at triage.

## 2013-10-14 LAB — CULTURE, GROUP A STREP

## 2013-10-20 NOTE — ED Provider Notes (Signed)
Medical screening examination/treatment/procedure(s) were performed by non-physician practitioner and as supervising physician I was immediately available for consultation/collaboration.   EKG Interpretation None        Essynce Munsch, MD 10/20/13 0651 

## 2013-11-28 ENCOUNTER — Encounter (HOSPITAL_COMMUNITY): Payer: Self-pay | Admitting: Emergency Medicine

## 2013-12-21 ENCOUNTER — Encounter: Payer: Medicaid Other | Admitting: Family Medicine

## 2013-12-27 ENCOUNTER — Encounter (HOSPITAL_COMMUNITY): Payer: Self-pay | Admitting: Emergency Medicine

## 2013-12-27 ENCOUNTER — Emergency Department (HOSPITAL_COMMUNITY)
Admission: EM | Admit: 2013-12-27 | Discharge: 2013-12-28 | Disposition: A | Discharge status: Home / Self Care | Payer: Medicaid Other | Attending: Emergency Medicine | Admitting: Emergency Medicine

## 2013-12-27 ENCOUNTER — Emergency Department (HOSPITAL_COMMUNITY): Payer: Medicaid Other

## 2013-12-27 DIAGNOSIS — Z880 Allergy status to penicillin: Secondary | ICD-10-CM | POA: Diagnosis not present

## 2013-12-27 DIAGNOSIS — Y9389 Activity, other specified: Secondary | ICD-10-CM | POA: Diagnosis not present

## 2013-12-27 DIAGNOSIS — Z862 Personal history of diseases of the blood and blood-forming organs and certain disorders involving the immune mechanism: Secondary | ICD-10-CM | POA: Insufficient documentation

## 2013-12-27 DIAGNOSIS — S39012A Strain of muscle, fascia and tendon of lower back, initial encounter: Secondary | ICD-10-CM | POA: Diagnosis present

## 2013-12-27 DIAGNOSIS — Z87891 Personal history of nicotine dependence: Secondary | ICD-10-CM | POA: Insufficient documentation

## 2013-12-27 DIAGNOSIS — R52 Pain, unspecified: Secondary | ICD-10-CM

## 2013-12-27 DIAGNOSIS — Y998 Other external cause status: Secondary | ICD-10-CM | POA: Insufficient documentation

## 2013-12-27 DIAGNOSIS — Y9289 Other specified places as the place of occurrence of the external cause: Secondary | ICD-10-CM | POA: Insufficient documentation

## 2013-12-27 DIAGNOSIS — W01198A Fall on same level from slipping, tripping and stumbling with subsequent striking against other object, initial encounter: Secondary | ICD-10-CM | POA: Insufficient documentation

## 2013-12-27 DIAGNOSIS — E669 Obesity, unspecified: Secondary | ICD-10-CM | POA: Diagnosis not present

## 2013-12-27 DIAGNOSIS — Z79899 Other long term (current) drug therapy: Secondary | ICD-10-CM | POA: Insufficient documentation

## 2013-12-27 MED ORDER — IBUPROFEN 600 MG PO TABS: 600.0000 mg | ORAL_TABLET | Freq: Four times a day (QID) | ORAL | Status: DC | PRN

## 2013-12-27 MED ORDER — CYCLOBENZAPRINE HCL 5 MG PO TABS: 5.0000 mg | ORAL_TABLET | Freq: Three times a day (TID) | ORAL | Status: DC | PRN

## 2013-12-27 MED ORDER — IBUPROFEN 400 MG PO TABS
600.0000 mg | ORAL_TABLET | Freq: Once | ORAL | Status: DC
  Filled 2013-12-27 (×2): qty 1

## 2013-12-27 NOTE — ED Provider Notes (Signed)
CSN: [id number]     Arrival date & time 12/27/13  1949 History   First MD Initiated Contact with Patient 12/27/13 2002     Chief Complaint  Patient presents with  . Fall     (Consider location/radiation/quality/duration/timing/severity/associated sxs/prior Treatment) HPI Comments: Slipped on wet floor in BR did not fall had no pain initially but 4 hours later noted to have some discomfort in R lower back that is worse with standing. Has not taken any OTC medication nor tried any therapies for pain but came immediately to ED for evaluation   Patient is a 38 y.o. female presenting with fall. The history is provided by the patient.  Fall This is a new problem. The current episode started today. The problem occurs constantly. The problem has been gradually worsening. Pertinent negatives include no abdominal pain, arthralgias, change in bowel habit, fever, nausea, numbness or weakness. Nothing aggravates the symptoms. She has tried nothing for the symptoms. The treatment provided no relief.    Past Medical History  Diagnosis Date  . Sickle cell trait    Past Surgical History  Procedure Laterality Date  . Cholecystectomy  2000   Family History  Problem Relation Age of Onset  . Diabetes Maternal Grandmother   . Hypertension Maternal Grandfather    History  Substance Use Topics  . Smoking status: Former Smoker -- 0.25 packs/day for 5 years    Types: Cigarettes    Quit date: 09/10/2006  . Smokeless tobacco: Never Used  . Alcohol Use: Yes     Comment: occasional    OB History    Gravida Para Term Preterm AB TAB SAB Ectopic Multiple Living   11 11 11       10      Review of Systems  Constitutional: Negative for fever.  Cardiovascular: Negative for leg swelling.  Gastrointestinal: Negative for nausea, abdominal pain and change in bowel habit.  Genitourinary: Negative for dysuria and hematuria.  Musculoskeletal: Positive for back pain. Negative for arthralgias.  Neurological:  Negative for weakness and numbness.  All other systems reviewed and are negative.     Allergies  Penicillins  Home Medications   Prior to Admission medications   Medication Sig Start Date End Date Taking? Authorizing Provider  cyclobenzaprine (FLEXERIL) 5 MG tablet Take 1 tablet (5 mg total) by mouth 3 (three) times daily as needed for muscle spasms. 12/27/13   Arman FilterGail K Singleton Hickox, NP  ibuprofen (ADVIL,MOTRIN) 600 MG tablet Take 1 tablet (600 mg total) by mouth every 6 (six) hours as needed. 12/27/13   Arman FilterGail K Sage Kopera, NP  nitrofurantoin, macrocrystal-monohydrate, (MACROBID) 100 MG capsule Take 1 capsule (100 mg total) by mouth 2 (two) times daily. 08/04/13   Aviva SignsMarie L Williams, CNM   BP 102/77 mmHg  Pulse 106  Temp(Src) 98.4 F (36.9 C)  Resp 16  Ht 5\' 2"  (1.575 m)  Wt 240 lb (108.863 kg)  BMI 43.89 kg/m2  SpO2 98%  LMP 12/13/2013 Physical Exam  Constitutional: She is oriented to person, place, and time. She appears well-developed and well-nourished.  obese  HENT:  Head: Normocephalic and atraumatic.  Eyes: Pupils are equal, round, and reactive to light.  Neck: Normal range of motion.  Cardiovascular: Normal rate and regular rhythm.   Pulmonary/Chest: Effort normal and breath sounds normal.  Musculoskeletal: Normal range of motion. She exhibits tenderness.       Back:  Gait steady without limp  Neurological: She is alert and oriented to person, place, and time.  Skin: Skin is warm and dry. No rash noted.  Nursing note and vitals reviewed.   ED Course  Procedures (including critical care time) Labs Review Labs Reviewed - No data to display  Imaging Review No results found.   EKG Interpretation None     PAtient insisted on lumbar xrays -- normal  MDM   Final diagnoses:  Lumbar strain, initial encounter         Arman FilterGail K Ramzey Petrovic, NP 12/27/13 2028  Arman FilterGail K Lehua Flores, NP 12/27/13 2349  Gerhard Munchobert Lockwood, MD 12/28/13 0021

## 2013-12-27 NOTE — ED Notes (Signed)
Pt. slipped and fell in bathroom at home this evening , no LOC / ambulatory , alert and oriented , reports pain at right lower back worse with movement / no hematuria .

## 2013-12-27 NOTE — Discharge Instructions (Signed)
Back Exercises Back exercises help treat and prevent back injuries. The goal is to increase your strength in your belly (abdominal) and back muscles. These exercises can also help with flexibility. Start these exercises when told by your doctor. HOME CARE Back exercises include: Pelvic Tilt.  Lie on your back with your knees bent. Tilt your pelvis until the lower part of your back is against the floor. Hold this position 5 to 10 sec. Repeat this exercise 5 to 10 times. Knee to Chest.  Pull 1 knee up against your chest and hold for 20 to 30 seconds. Repeat this with the other knee. This may be done with the other leg straight or bent, whichever feels better. Then, pull both knees up against your chest. Sit-Ups or Curl-Ups.  Bend your knees 90 degrees. Start with tilting your pelvis, and do a partial, slow sit-up. Only lift your upper half 30 to 45 degrees off the floor. Take at least 2 to 3 seonds for each sit-up. Do not do sit-ups with your knees out straight. If partial sit-ups are difficult, simply do the above but with only tightening your belly (abdominal) muscles and holding it as told. Hip-Lift.  Lie on your back with your knees flexed 90 degrees. Push down with your feet and shoulders as you raise your hips 2 inches off the floor. Hold for 10 seconds, repeat 5 to 10 times. Back Arches.  Lie on your stomach. Prop yourself up on bent elbows. Slowly press on your hands, causing an arch in your low back. Repeat 3 to 5 times. Shoulder-Lifts.  Lie face down with arms beside your body. Keep hips and belly pressed to floor as you slowly lift your head and shoulders off the floor. Do not overdo your exercises. Be careful in the beginning. Exercises may cause you some mild back discomfort. If the pain lasts for more than 15 minutes, stop the exercises until you see your doctor. Improvement with exercise for back problems is slow.  Document Released: 02/15/2010 Document Revised: 04/07/2011  Document Reviewed: 11/14/2010 [facility] Patient Information 2015 [place]. This information is not intended to replace advice given to you by your health care provider. Make sure you discuss any questions you have with your health care provider. Your xray is normal

## 2014-01-23 ENCOUNTER — Encounter: Payer: Medicaid Other | Admitting: Family Medicine

## 2014-04-06 ENCOUNTER — Encounter: Payer: Medicaid Other | Admitting: Family Medicine

## 2014-07-17 ENCOUNTER — Encounter: Payer: Medicaid Other | Admitting: Family Medicine

## 2014-08-07 ENCOUNTER — Encounter: Payer: Medicaid Other | Admitting: Family Medicine

## 2014-12-05 ENCOUNTER — Encounter: Payer: Medicaid Other | Admitting: Family Medicine

## 2015-01-02 ENCOUNTER — Ambulatory Visit (INDEPENDENT_AMBULATORY_CARE_PROVIDER_SITE_OTHER): Payer: Medicaid Other | Admitting: Family Medicine

## 2015-01-02 ENCOUNTER — Encounter: Payer: Self-pay | Admitting: Family Medicine

## 2015-01-02 VITALS — BP 135/77 | HR 95 | Temp 98.5°F | Ht 62.0 in | Wt 224.3 lb

## 2015-01-02 DIAGNOSIS — M545 Low back pain, unspecified: Secondary | ICD-10-CM

## 2015-01-02 DIAGNOSIS — R350 Frequency of micturition: Secondary | ICD-10-CM

## 2015-01-02 DIAGNOSIS — D571 Sickle-cell disease without crisis: Secondary | ICD-10-CM | POA: Diagnosis not present

## 2015-01-02 DIAGNOSIS — E669 Obesity, unspecified: Secondary | ICD-10-CM

## 2015-01-02 DIAGNOSIS — Z202 Contact with and (suspected) exposure to infections with a predominantly sexual mode of transmission: Secondary | ICD-10-CM | POA: Diagnosis not present

## 2015-01-02 DIAGNOSIS — Z0000 Encounter for general adult medical examination without abnormal findings: Secondary | ICD-10-CM | POA: Diagnosis not present

## 2015-01-02 DIAGNOSIS — E876 Hypokalemia: Secondary | ICD-10-CM | POA: Diagnosis not present

## 2015-01-02 LAB — LIPID PANEL
Cholesterol: 165 mg/dL (ref 125–200)
HDL: 43 mg/dL — ABNORMAL LOW (ref >=46)
LDL CALC: 102 mg/dL (ref <130)
Total CHOL/HDL Ratio: 3.8 Ratio (ref <=5.0)
Triglycerides: 98 mg/dL (ref <150)
VLDL: 20 mg/dL (ref <30)

## 2015-01-02 LAB — BASIC METABOLIC PANEL WITH GFR
BUN: 13 mg/dL (ref 7–25)
CO2: 26 mmol/L (ref 20–31)
Calcium: 8.8 mg/dL (ref 8.6–10.2)
Chloride: 106 mmol/L (ref 98–110)
Creat: 0.6 mg/dL (ref 0.50–1.10)
GFR, Est Non African American: >89 mL/min (ref >=60)
GLUCOSE: 87 mg/dL (ref 65–99)
POTASSIUM: 4.1 mmol/L (ref 3.5–5.3)
Sodium: 137 mmol/L (ref 135–146)

## 2015-01-02 LAB — POCT URINALYSIS DIPSTICK
Bilirubin, UA: NEGATIVE
Blood, UA: NEGATIVE
Glucose, UA: NEGATIVE
Ketones, UA: NEGATIVE
LEUKOCYTES UA: NEGATIVE
Nitrite, UA: NEGATIVE
PROTEIN UA: NEGATIVE
Spec Grav, UA: 1.02
UROBILINOGEN UA: 0.2
pH, UA: 6

## 2015-01-02 NOTE — Progress Notes (Signed)
Subjective:     Patient ID: Kathryn Calderon, female   DOB: 05/16/1975, 39 y.o.   MRN: [id number]  HPI Kathryn Calderon is a 39yo female presenting today for annual exam.  # Low Back Pain: - Has been present for a few weeks, unchanged - States pain only occurs when she is holding her urine. Denies pain with movement, positions, or at any other time - Occurs on right lower back - Denies radiation, numbness, tingling, fecal or urinary incontinence, problem with gait, saddle anesthesia - Denies dysuria, discharge, vaginal itching, urgency - Notes urinary frequency - Has tried over the counter medications, including Aspirin, Tylenol, ALeve  # Health Maintenance: - Due for mammogram. Last screening in 07/2013. - Last pap smear in 01/2012 with normal cytology and negative HPV - Last CMP 09/2011 with hypokalemia of 3.3 - Last CBC 04/2012 with anemia: hemoglobin 8.8, WBC 22.2. History of sickle cell trait noted. - No history of lipid panel. - Denies history of smoking - Problem list and medications reviewed and updated - Note that after office visit while getting labs and significant other not in room, asked nurse if she could come back to get screened for STDs. Did not wish for significant other to know.  Review of Systems Per HPI    Objective:   Physical Exam  Constitutional: She appears well-developed and well-nourished. No distress.  HENT:  Head: Normocephalic and atraumatic.  Mouth/Throat: Oropharynx is clear and moist.  Eyes: Pupils are equal, round, and reactive to light. Right eye exhibits no discharge. Left eye exhibits no discharge.  Neck: Normal range of motion. No thyromegaly present.  Cardiovascular: Normal rate.  Exam reveals no gallop and no friction rub.   No murmur heard. Pulmonary/Chest: Effort normal. No respiratory distress. She has no wheezes. She has no rales.  Abdominal: Soft. Bowel sounds are normal. She exhibits no distension. There is no tenderness.  Musculoskeletal:  Normal range of motion. She exhibits no edema.  Mild tenderness with deep palpation of right lumbar paraspinal muscles  Neurological: She is alert.  Skin: No rash noted.  Psychiatric: She has a normal mood and affect. Her behavior is normal.       Assessment and Plan:     Low back pain - States only occurs when holding her urine and denies any pain at all other times, including with movement and position. Urinalysis normal. Counseled on scheduled urinations to retrain bladder and hopefully help prevent further pain. - Continue over the counter pain medications as needed - If no improvement, consider musculoskeletal etiology given tenderness with deep palpation of right paraspinal muscles. Unusual in that pain never occurs with movement and position and only when holding urination. Consider muscle relaxer or referral to physical therapy at next visit if no improvement.  Health care maintenance - Handout given for mammogram - Will obtain CBC, BMP, and lipid panel today - Up to date on pap smear - Note: after visit while in lab and significant other not in room, requested screening for STDs. Notified by nursing of request. Will place future orders for labs. To call and schedule nurse visit on day when she can return without husband.

## 2015-01-02 NOTE — Assessment & Plan Note (Signed)
-   States only occurs when holding her urine and denies any pain at all other times, including with movement and position. Urinalysis normal. Counseled on scheduled urinations to retrain bladder and hopefully help prevent further pain. - Continue over the counter pain medications as needed - If no improvement, consider musculoskeletal etiology given tenderness with deep palpation of right paraspinal muscles. Unusual in that pain never occurs with movement and position and only when holding urination. Consider muscle relaxer or referral to physical therapy at next visit if no improvement.

## 2015-01-02 NOTE — Patient Instructions (Signed)
Thank you so much for coming to visit today! I checked several labs today. You will receive a letter concerning the results. Please try to urinate on a schedule and not hold your urine to prevent back pain. Please follow up in one year or sooner depending on your lab results.   Thanks again! Dr. Caroleen Hammanumley

## 2015-01-02 NOTE — Assessment & Plan Note (Signed)
-   Handout given for mammogram - Will obtain CBC, BMP, and lipid panel today - Up to date on pap smear - Note: after visit while in lab and significant other not in room, requested screening for STDs. Notified by nursing of request. Will place future orders for labs. To call and schedule nurse visit on day when she can return without husband.

## 2015-01-03 ENCOUNTER — Encounter: Payer: Self-pay | Admitting: Family Medicine

## 2015-01-03 ENCOUNTER — Telehealth: Payer: Self-pay | Admitting: Family Medicine

## 2015-01-03 LAB — CBC
HCT: 29.8 % — ABNORMAL LOW (ref 36.0–46.0)
HEMOGLOBIN: 8.7 g/dL — AB (ref 12.0–15.0)
MCH: 18.8 pg — ABNORMAL LOW (ref 26.0–34.0)
MCHC: 29.2 g/dL — ABNORMAL LOW (ref 30.0–36.0)
MCV: 64.5 fL — ABNORMAL LOW (ref 78.0–100.0)
MPV: 9.1 fL (ref 8.6–12.4)
Platelets: 445 10*3/uL — ABNORMAL HIGH (ref 150–400)
RBC: 4.62 MIL/uL (ref 3.87–5.11)
RDW: 21.8 % — ABNORMAL HIGH (ref 11.5–15.5)
WBC: 9.2 10*3/uL (ref 4.0–10.5)

## 2015-01-03 NOTE — Telephone Encounter (Signed)
Would like yesterdays test results   °

## 2015-01-03 NOTE — Telephone Encounter (Signed)
Will forward to Dr. Caroleen Hammanumley who saw patient for visit. Cerinity Zynda,CMA

## 2015-01-29 ENCOUNTER — Encounter (HOSPITAL_COMMUNITY): Payer: Self-pay | Admitting: Emergency Medicine

## 2015-01-29 ENCOUNTER — Emergency Department (HOSPITAL_COMMUNITY)
Admission: EM | Admit: 2015-01-29 | Discharge: 2015-01-29 | Disposition: A | Discharge status: Home / Self Care | Payer: Medicaid Other | Attending: Emergency Medicine | Admitting: Emergency Medicine

## 2015-01-29 DIAGNOSIS — Y998 Other external cause status: Secondary | ICD-10-CM | POA: Insufficient documentation

## 2015-01-29 DIAGNOSIS — Y9289 Other specified places as the place of occurrence of the external cause: Secondary | ICD-10-CM | POA: Diagnosis not present

## 2015-01-29 DIAGNOSIS — R6883 Chills (without fever): Secondary | ICD-10-CM | POA: Diagnosis not present

## 2015-01-29 DIAGNOSIS — S299XXA Unspecified injury of thorax, initial encounter: Secondary | ICD-10-CM | POA: Diagnosis present

## 2015-01-29 DIAGNOSIS — R0602 Shortness of breath: Secondary | ICD-10-CM | POA: Insufficient documentation

## 2015-01-29 DIAGNOSIS — Y9389 Activity, other specified: Secondary | ICD-10-CM | POA: Insufficient documentation

## 2015-01-29 DIAGNOSIS — S20212A Contusion of left front wall of thorax, initial encounter: Secondary | ICD-10-CM | POA: Diagnosis not present

## 2015-01-29 DIAGNOSIS — Z87891 Personal history of nicotine dependence: Secondary | ICD-10-CM | POA: Insufficient documentation

## 2015-01-29 DIAGNOSIS — W01198A Fall on same level from slipping, tripping and stumbling with subsequent striking against other object, initial encounter: Secondary | ICD-10-CM | POA: Insufficient documentation

## 2015-01-29 DIAGNOSIS — Z862 Personal history of diseases of the blood and blood-forming organs and certain disorders involving the immune mechanism: Secondary | ICD-10-CM | POA: Diagnosis not present

## 2015-01-29 DIAGNOSIS — Z880 Allergy status to penicillin: Secondary | ICD-10-CM | POA: Diagnosis not present

## 2015-01-29 MED ORDER — TRAMADOL HCL 50 MG PO TABS: 50.0000 mg | ORAL_TABLET | Freq: Four times a day (QID) | ORAL | Status: DC | PRN

## 2015-01-29 MED ORDER — CYCLOBENZAPRINE HCL 5 MG PO TABS: 5.0000 mg | ORAL_TABLET | Freq: Three times a day (TID) | ORAL | Status: DC

## 2015-01-29 MED ORDER — CYCLOBENZAPRINE HCL 10 MG PO TABS
5.0000 mg | ORAL_TABLET | Freq: Once | ORAL | Status: AC
  Administered 2015-01-29: 5 mg via ORAL
  Filled 2015-01-29: qty 1

## 2015-01-29 MED ORDER — TRAMADOL HCL 50 MG PO TABS
50.0000 mg | ORAL_TABLET | Freq: Once | ORAL | Status: AC
  Administered 2015-01-29: 50 mg via ORAL
  Filled 2015-01-29: qty 1

## 2015-01-29 NOTE — ED Provider Notes (Signed)
CSN: [id number]     Arrival date & time 01/29/15  1751 History   By signing my name below, I, Arlan Organshley Leger, attest that this documentation has been prepared under the direction and in the presence of Earley FavorGail Zorion Nims, FNP.  Electronically Signed: Arlan OrganAshley Leger, ED Scribe. 01/29/2015. 8:17 PM.   Chief Complaint  Patient presents with  . Fall   The history is provided by the patient. No language interpreter was used.    HPI Comments: Kathryn Calderon is a 40 y.o. female with a PMHx of sickle cell trait who presents to the Emergency Department here after a fall sustained 2 days ago. Pt states she was outside when she slipped and hit her L side on a brick wall. No head trauma or LOC. She now c/o constant, ongoing L side pain and a short episode of shortness of breath yesterday. Discomfort to L side exacerbated with movement. No alleviating factors at this time. No OTC medications or home remedies attempted prior to arrival. No recent fever, chills, nausea, vomiting, or chest pain.  PCP: Wenda LowJames Joyner, MD    Past Medical History  Diagnosis Date  . Sickle cell trait [facility](HCC)    Past Surgical History  Procedure Laterality Date  . Cholecystectomy  2000   Family History  Problem Relation Age of Onset  . Diabetes Maternal Grandmother   . Hypertension Maternal Grandfather    Social History  Substance Use Topics  . Smoking status: Former Smoker -- 0.25 packs/day for 5 years    Types: Cigarettes    Quit date: 09/10/2006  . Smokeless tobacco: Never Used  . Alcohol Use: Yes     Comment: occasional    OB History    Gravida Para Term Preterm AB TAB SAB Ectopic Multiple Living   11 11 11       10      Review of Systems  Constitutional: Positive for chills. Negative for fever.  Respiratory: Positive for shortness of breath. Negative for cough and chest tightness.   Gastrointestinal: Negative for vomiting and diarrhea.  Psychiatric/Behavioral: Negative for confusion.      Allergies   Penicillins  Home Medications   Prior to Admission medications   Not on File   Triage Vitals: BP 134/86 mmHg  Pulse 87  Temp(Src) 98.5 F (36.9 C) (Oral)  Resp 16  SpO2 100%  LMP 01/14/2015   Physical Exam  Constitutional: She is oriented to person, place, and time. She appears well-developed and well-nourished.  HENT:  Head: Normocephalic.  Eyes: EOM are normal.  Neck: Normal range of motion.  Pulmonary/Chest: Effort normal and breath sounds normal. No respiratory distress. She has no wheezes. She has no rales. She exhibits tenderness.  Symmetrical chest movement Clear breath sounds No bruising   Abdominal: She exhibits no distension.  Musculoskeletal: Normal range of motion.  Neurological: She is alert and oriented to person, place, and time.  Psychiatric: She has a normal mood and affect.  Nursing note and vitals reviewed.   ED Course  Procedures (including critical care time)  DIAGNOSTIC STUDIES: Oxygen Saturation is 100% on RA, Normal by my interpretation.    COORDINATION OF CARE: 8:14 PM- Will give Flexeril and Ultram. Discussed treatment plan with pt at bedside and pt agreed to plan.     Labs Review Labs Reviewed - No data to display  Imaging Review No results found. I have personally reviewed and evaluated these images and lab results as part of my medical decision-making.   EKG  Interpretation None     Will Rx Flexeril and Ultram  MDM   Final diagnoses:  None    I personally performed the services described in this documentation, which was scribed in my presence. The recorded information has been reviewed and is accurate.  Earley Favor, NP 01/29/15 [id number]  Nelva Nay, MD 02/03/15 [phone]

## 2015-01-29 NOTE — Discharge Instructions (Signed)
Chest Contusion °A contusion is a deep bruise. Bruises happen when an injury causes bleeding under the skin. Signs of bruising include pain, puffiness (swelling), and discolored skin. The bruise may turn blue, purple, or yellow.  °HOME CARE °· Put ice on the injured area. °¨ Put ice in a plastic bag. °¨ Place a towel between the skin and the bag. °¨ Leave the ice on for 15-20 minutes at a time, 03-04 times a day for the first 48 hours. °· Only take medicine as told by your doctor. °· Rest. °· Take deep breaths (deep-breathing exercises) as told by your doctor. °· Stop smoking if you smoke. °· Do not lift objects over 5 pounds (2.3 kilograms) for 3 days or longer if told by your doctor. °GET HELP RIGHT AWAY IF:  °· You have more bruising or puffiness. °· You have pain that gets worse. °· You have trouble breathing. °· You are dizzy, weak, or pass out (faint). °· You have blood in your pee (urine) or poop (stool). °· You cough up or throw up (vomit) blood. °· Your puffiness or pain is not helped with medicines. °MAKE SURE YOU:  °· Understand these instructions. °· Will watch your condition. °· Will get help right away if you are not doing well or get worse. °  °This information is not intended to replace advice given to you by your health care provider. Make sure you discuss any questions you have with your health care provider. °  °Document Released: 07/02/2007 Document Revised: 10/08/2011 Document Reviewed: 07/07/2011 °Elsevier Interactive Patient Education ©2016 [employer] Inc. ° °

## 2015-01-29 NOTE — ED Notes (Signed)
Per pt, states she fell on left side 2 days ago-having left side pain

## 2015-05-21 ENCOUNTER — Ambulatory Visit: Payer: Medicaid Other | Admitting: Family Medicine

## 2015-05-30 ENCOUNTER — Other Ambulatory Visit (HOSPITAL_COMMUNITY)
Admission: RE | Admit: 2015-05-30 | Discharge: 2015-05-30 | Disposition: A | Discharge status: Home / Self Care | Payer: Medicaid Other | Source: Ambulatory Visit | Attending: Family Medicine | Admitting: Family Medicine

## 2015-05-30 ENCOUNTER — Ambulatory Visit (INDEPENDENT_AMBULATORY_CARE_PROVIDER_SITE_OTHER): Payer: Medicaid Other | Admitting: Family Medicine

## 2015-05-30 DIAGNOSIS — N898 Other specified noninflammatory disorders of vagina: Secondary | ICD-10-CM | POA: Diagnosis not present

## 2015-05-30 DIAGNOSIS — N920 Excessive and frequent menstruation with regular cycle: Secondary | ICD-10-CM | POA: Diagnosis not present

## 2015-05-30 DIAGNOSIS — Z113 Encounter for screening for infections with a predominantly sexual mode of transmission: Secondary | ICD-10-CM | POA: Diagnosis not present

## 2015-05-30 DIAGNOSIS — D509 Iron deficiency anemia, unspecified: Secondary | ICD-10-CM | POA: Insufficient documentation

## 2015-05-30 DIAGNOSIS — D649 Anemia, unspecified: Secondary | ICD-10-CM | POA: Diagnosis not present

## 2015-05-30 DIAGNOSIS — N921 Excessive and frequent menstruation with irregular cycle: Secondary | ICD-10-CM | POA: Insufficient documentation

## 2015-05-30 LAB — POCT WET PREP (WET MOUNT): Clue Cells Wet Prep Whiff POC: NEGATIVE

## 2015-05-30 LAB — FERRITIN: Ferritin: 5 ng/mL — ABNORMAL LOW (ref 10–232)

## 2015-05-30 LAB — CBC
HEMATOCRIT: 23.4 % — AB (ref 35.0–45.0)
Hemoglobin: 6.5 g/dL — CL (ref 11.7–15.5)
MCH: 17.3 pg — ABNORMAL LOW (ref 27.0–33.0)
MCHC: 27.8 g/dL — ABNORMAL LOW (ref 32.0–36.0)
MCV: 62.2 fL — AB (ref 80.0–100.0)
MPV: 9.1 fL (ref 7.5–12.5)
Platelets: 472 10*3/uL — ABNORMAL HIGH (ref 140–400)
RBC: 3.76 MIL/uL — AB (ref 3.80–5.10)
RDW: 21.9 % — ABNORMAL HIGH (ref 11.0–15.0)
WBC: 10.1 10*3/uL (ref 3.8–10.8)

## 2015-05-30 MED ORDER — MEDROXYPROGESTERONE ACETATE 10 MG PO TABS
10.0000 mg | ORAL_TABLET | Freq: Every day | ORAL | Status: DC
Start: 2015-05-30 — End: 2016-06-24

## 2015-05-30 NOTE — Assessment & Plan Note (Signed)
-   Check CBC, iron panel - Likely will need to start iron supplementation

## 2015-05-30 NOTE — Assessment & Plan Note (Signed)
Check CBC and Urine pregnancy.  Heavy irregular menses, likely due to obesity.  Will treat with Provera to induce withdrawal bleed - Provera 10 mg daily 10 days  - Advise follow-up and to 3 months.  If continues to have heavy irregular bleeding, will order pelvic ultrasound to assess for uterine pathology and consider endometrial biopsy.

## 2015-05-30 NOTE — Patient Instructions (Signed)
I have order some labs today to check your anemia. I will send you a letter with the results, or call you if we need to make any changes to your current therapies.   Take Medroxyprogesterone 10 mg a day for 10 days. This should cause your current period to end.   Make an appointment in the next 2-3 months if you continue to have heavy or irregular periods.

## 2015-05-30 NOTE — Progress Notes (Signed)
  Patient name: Kathryn Calderon MRN [id number]  Date of birth: 12/13/1975  CC & HPI:  Kathryn Calderon is a 40 y.o. female presenting today for annual physical.  She reports she is currently on her period and this period has last for the past 9 days. Also reports this is her second period this month.  Prior to this she reports regular menstrual cycles.  Denies history of heavy or irregular menses.  Endorses some fatigue, but denies chest pain, shortness of breath, palpitations or lightheadedness, dizziness with standing.  Reports she has been sexually active with a past year without condom use and request STD testing.   Smoking History Noted  Objective Findings:  Vitals: BP 124/82 mmHg  Pulse 61  Temp(Src) 98.7 F (37.1 C) (Oral)  Ht 5\' 2"  (1.575 m)  Wt 223 lb (101.152 kg)  BMI 40.78 kg/m2  LMP 05/22/2015  Gen: NAD CV: RRR w/o m/r/g, pulses +2 b/l Resp: CTAB w/ normal respiratory effort Speculum Exam: Ext genitalia: wnl; Vaginal discharge: Thin, serosanguineous; Cervix: wnl Bimanual Exam: No Cervical motion tenderness; No Vaginal wall defects; Adnexa nontender; no fibroids palpated  Assessment & Plan:   Menorrhagia with irregular cycle Check CBC and Urine pregnancy.  Heavy irregular menses, likely due to obesity.  Will treat with Provera to induce withdrawal bleed - Provera 10 mg daily 10 days  - Advise follow-up and to 3 months.  If continues to have heavy irregular bleeding, will order pelvic ultrasound to assess for uterine pathology and consider endometrial biopsy.   Anemia - Check CBC, iron panel - Likely will need to start iron supplementation  Vaginal discharge - POCT Wet Prep [facility]([place])  Screen for STD (sexually transmitted disease) - Cervicovaginal ancillary only - RPR - HIV antibody

## 2015-05-31 ENCOUNTER — Telehealth: Payer: Self-pay | Admitting: Family Medicine

## 2015-05-31 DIAGNOSIS — D509 Iron deficiency anemia, unspecified: Secondary | ICD-10-CM

## 2015-05-31 LAB — IRON AND TIBC
%SAT: 1 % — AB (ref 11–50)
Iron: <10 ug/dL — ABNORMAL LOW (ref 40–190)
TIBC: 447 ug/dL (ref 250–450)
UIBC: 442 ug/dL — ABNORMAL HIGH (ref 125–400)

## 2015-05-31 LAB — RPR

## 2015-05-31 LAB — HIV ANTIBODY (ROUTINE TESTING W REFLEX): HIV 1&2 Ab, 4th Generation: NONREACTIVE

## 2015-05-31 NOTE — Telephone Encounter (Signed)
Patient called in requesting lab results drawn yesterday but hung up before results and instructions could be given Spoke with patient's PCP, Dr. Gayla DossJoyner Patient to begin ferrous sulfate 325 mg TID which PCP will e-scribe to patient's pharmacy Patient to begin IV iron infusions which will be set-up by our office. Patient should expect call to schedule time and first infusion should occur within next week Patient should schedule appt to f/u with PCP in 3-4 weeks If patient experiences any chest pain, SHOB or heart palpitations she should go directly to ED No answer at patient's home number and VM not set up.  HIPAA compliant message left on patient's mobile VM to return call  DUCATTE, Nickola MajorLAURENZE L, RN

## 2015-05-31 NOTE — Telephone Encounter (Signed)
Patient returned call and is aware of lab results and instructions below [facility]ified [facility] [address] [facility] [address] [place] DUCATTE, Nickola MajorLAURENZE L, [place]

## 2015-05-31 NOTE — Telephone Encounter (Signed)
Pt called and would like to know what her results from yesterday are. jw

## 2015-06-01 MED ORDER — FERUMOXYTOL INJECTION 510 MG/17 ML: 510.0000 mg | Freq: Once | INTRAVENOUS | Status: DC

## 2015-06-01 MED ORDER — FERROUS SULFATE 325 (65 FE) MG PO TBEC: 325.0000 mg | DELAYED_RELEASE_TABLET | Freq: Three times a day (TID) | ORAL | Status: DC

## 2015-06-01 NOTE — Telephone Encounter (Signed)
Discussed her Iron deficiency anemia.  We'll start iron supplements 3 times a day as well as order IV Feraheme.  - follow-up with me in 3-4 weeks for recheck of her hemoglobin, or sooner if she develops any chest pain, shortness of breath, dizziness or palpitations

## 2015-06-02 LAB — CERVICOVAGINAL ANCILLARY ONLY
Chlamydia: NEGATIVE
Neisseria Gonorrhea: NEGATIVE

## 2015-12-21 IMAGING — DX DG LUMBAR SPINE COMPLETE 4+V
5 series · 5 of 5 positions shown · non-contrast
Comparison: None.

CLINICAL DATA: Initial evaluation for acute trauma, fall. Now with
acute low back pain.

EXAM:
LUMBAR SPINE - COMPLETE 4+ VIEW

[l-spine ap]
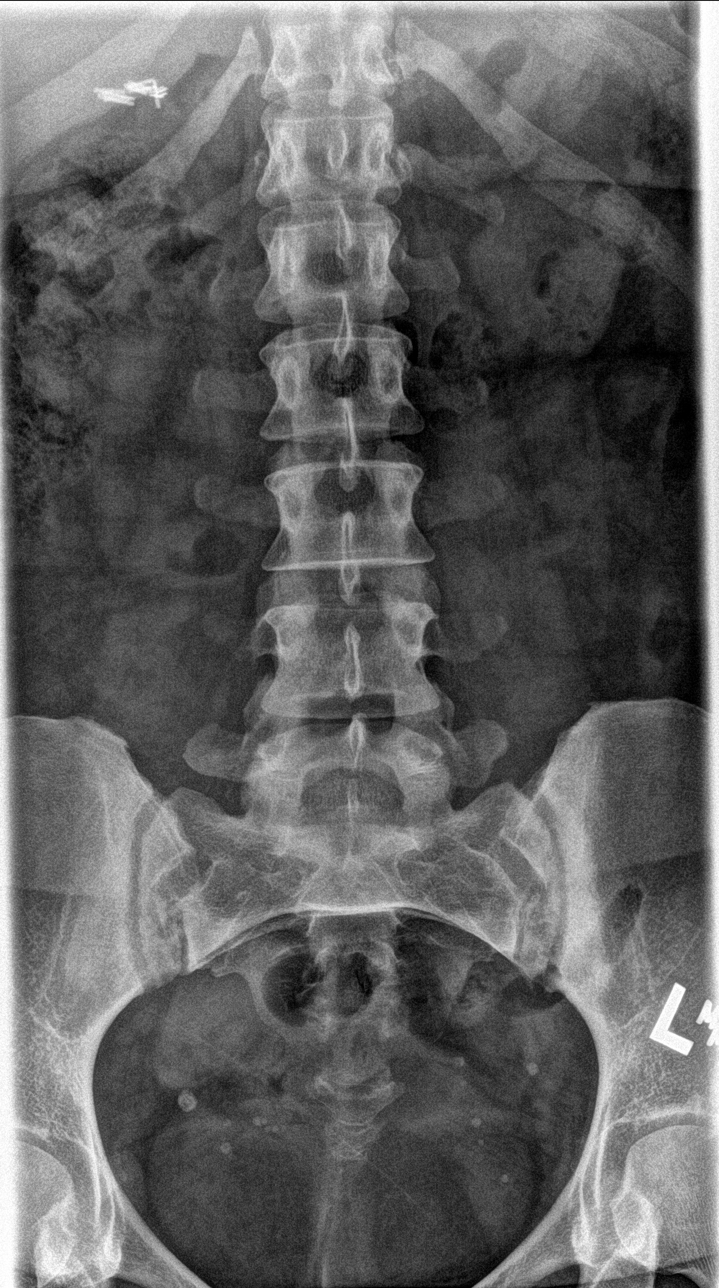

[l-spine obl (1 of 2)]
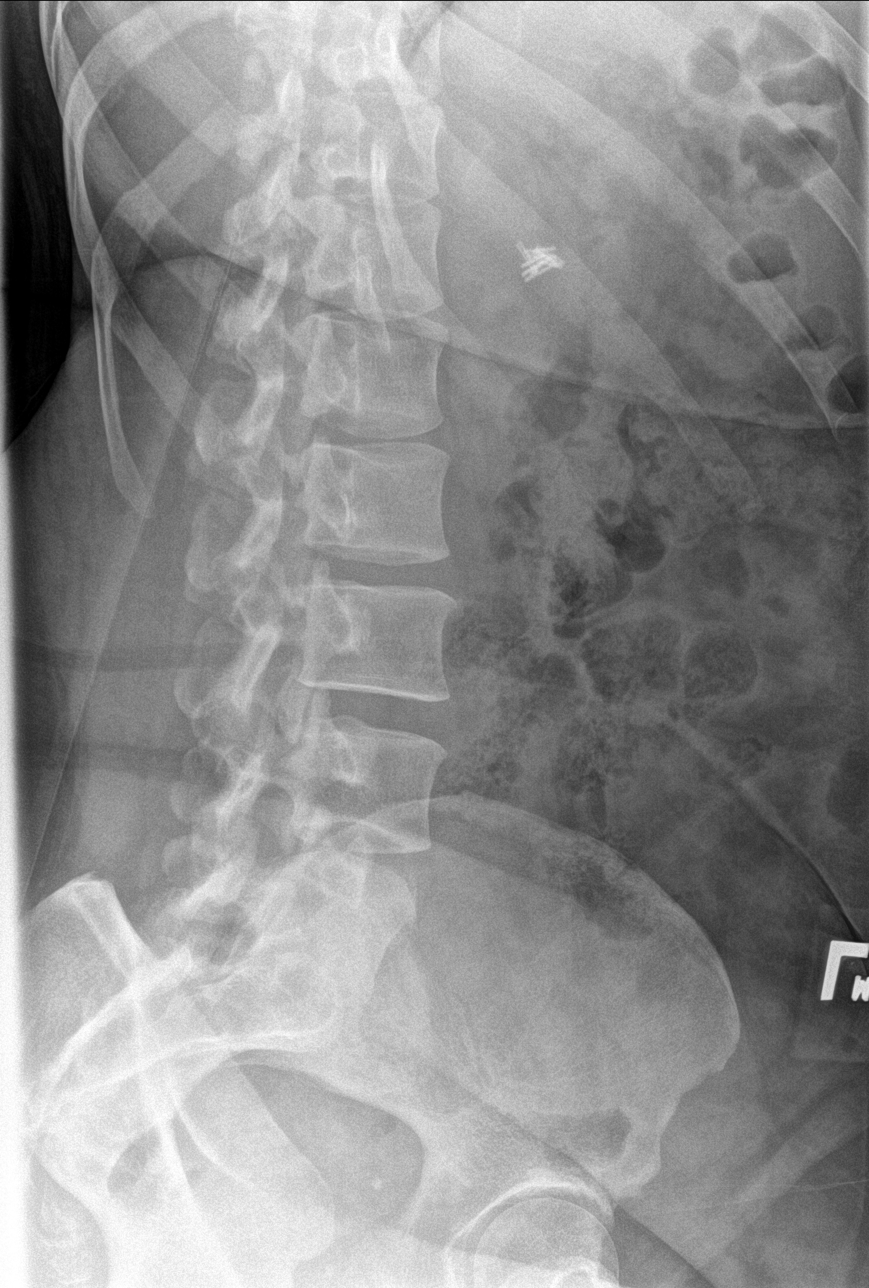

[l-spine lat]
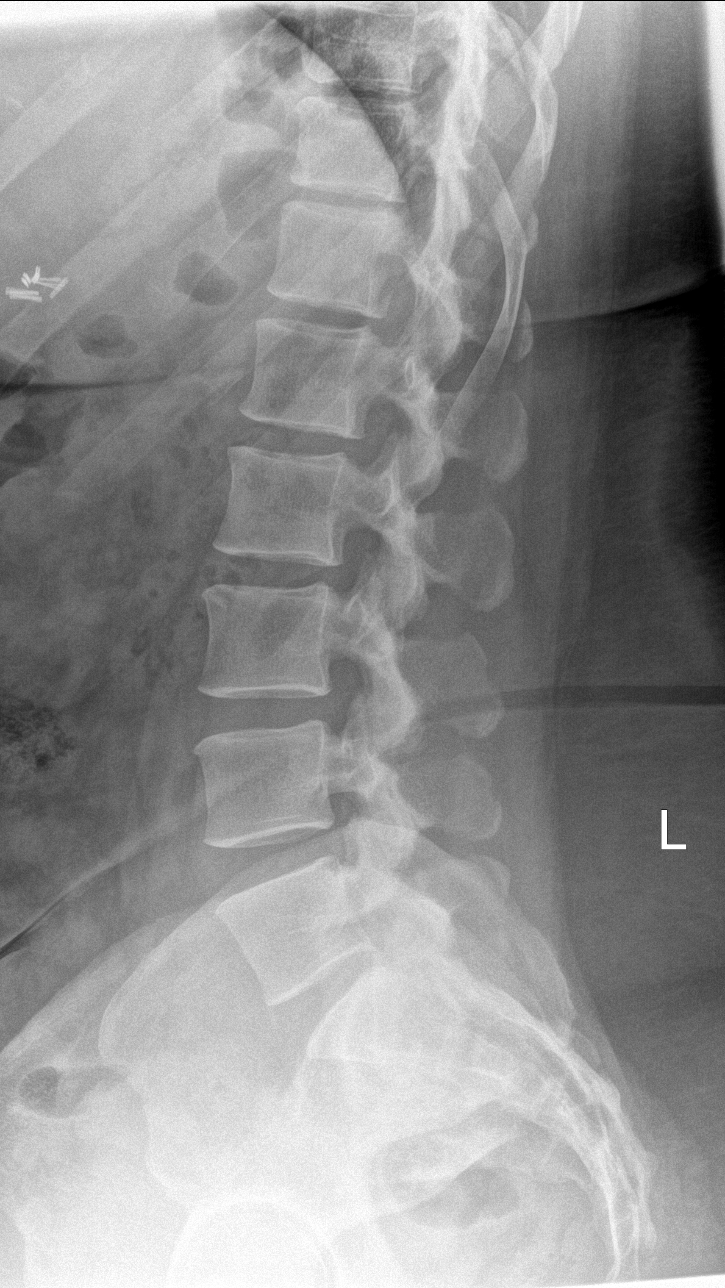

[l-spine spot]
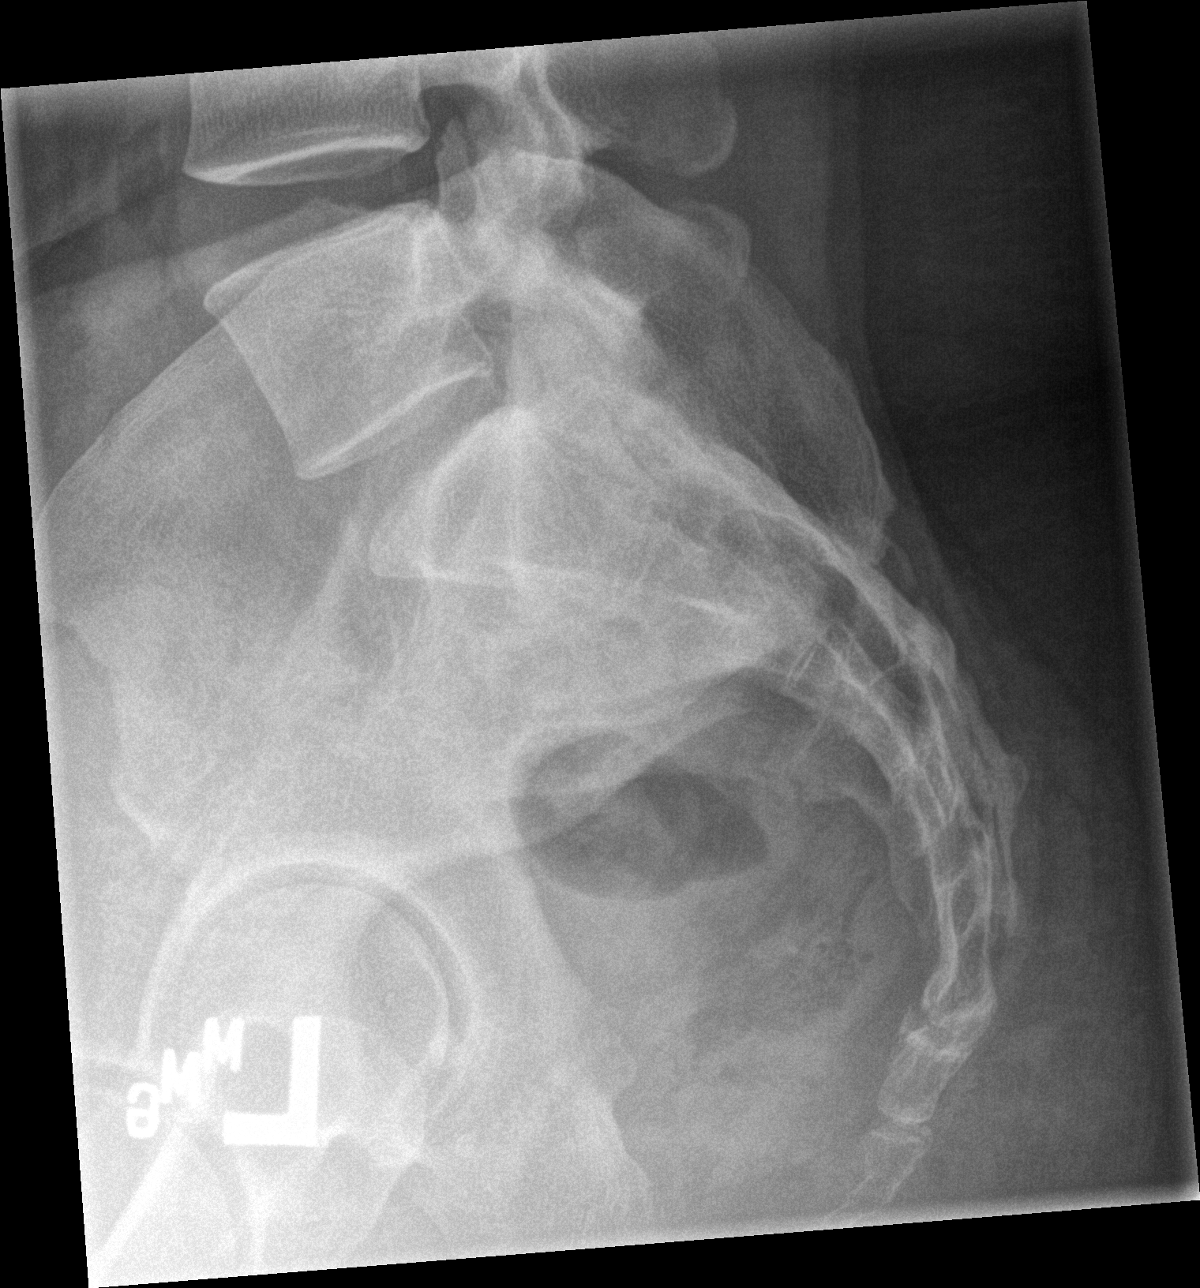

[l-spine obl (2 of 2)]
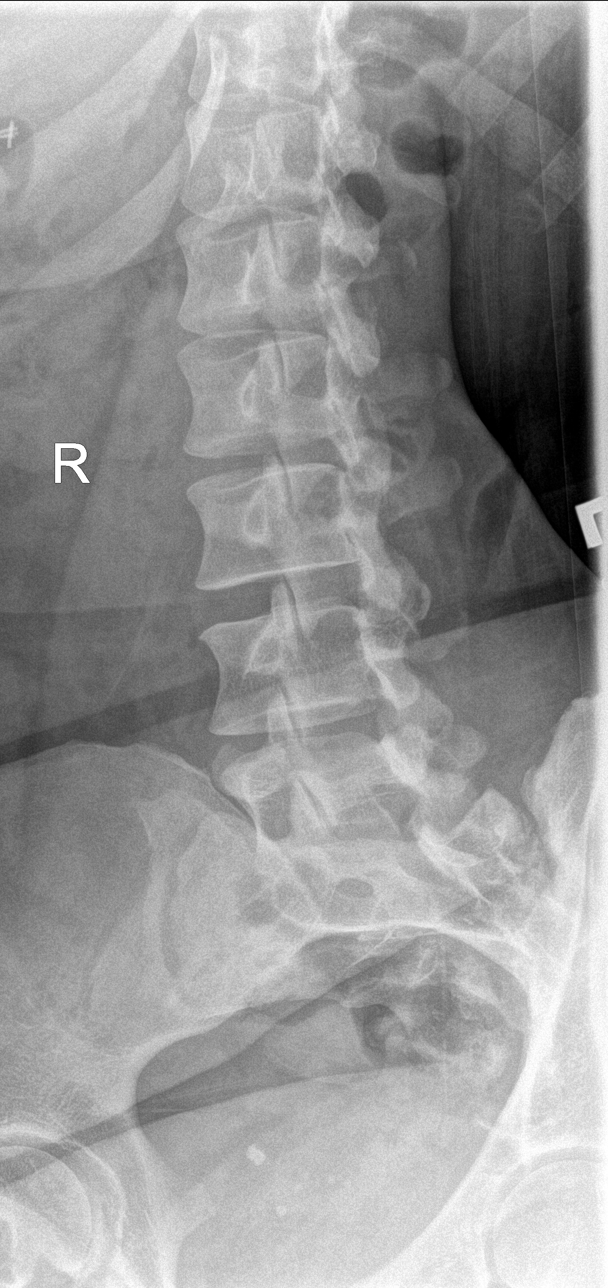

[5 of 5 positions shown; findings below may reference images not displayed]

FINDINGS: Vertebral bodies are normally aligned with preservation of the
normal lumbar lordosis paravertebral body heights are preserved. No
acute fracture or listhesis. Intervertebral disc spaces
well-preserved without evidence of significant degenerative disc
disease. Mild irregularity noted at the distal sacrum/coccyx without
definite fracture.

SI joints are approximated.

No soft tissue abnormality. Cholecystectomy clips noted in the right
upper quadrant.
IMPRESSION: No acute fracture or listhesis within the lumbar spine.

## 2016-06-24 ENCOUNTER — Encounter: Payer: Self-pay | Admitting: Family Medicine

## 2016-06-24 ENCOUNTER — Other Ambulatory Visit (HOSPITAL_COMMUNITY)
Admission: RE | Admit: 2016-06-24 | Discharge: 2016-06-24 | Disposition: A | Discharge status: Home / Self Care | Payer: Medicaid Other | Source: Ambulatory Visit | Attending: Family Medicine | Admitting: Family Medicine

## 2016-06-24 ENCOUNTER — Ambulatory Visit (INDEPENDENT_AMBULATORY_CARE_PROVIDER_SITE_OTHER): Payer: Medicaid Other | Admitting: Family Medicine

## 2016-06-24 VITALS — BP 136/76 | HR 88 | Temp 98.3°F | Wt 223.0 lb

## 2016-06-24 DIAGNOSIS — Z0000 Encounter for general adult medical examination without abnormal findings: Secondary | ICD-10-CM | POA: Insufficient documentation

## 2016-06-24 NOTE — Progress Notes (Signed)
   Subjective:  Kathryn Calderon is a 41 y.o. female who [facility] today with a chief complaint of annual physical exam.   HPI:  Annual Physical Exam No acute complaints today. Patient currently stays at home with her children. Does not regularly exercise. Trying to eat healthier.   ROS: Per HPI, otherwise all systems reviewed and are negative  PMH:  The following were reviewed and entered/updated in epic: Past Medical History:  Diagnosis Date  . Sickle cell trait [facility](HCC)    Patient Active Problem List   Diagnosis Date Noted  . Menorrhagia with irregular cycle 05/30/2015  . Iron deficiency anemia 05/30/2015  . Health care maintenance 01/02/2015  . Allergic rhinitis 09/10/2011  . Obesity, unspecified 12/13/2007  . SICKLE CELL TRAIT 03/26/2006   Past Surgical History:  Procedure Laterality Date  . CHOLECYSTECTOMY  2000    Family History  Problem Relation Age of Onset  . Diabetes Maternal Grandmother   . Hypertension Maternal Grandfather     Medications- reviewed and updated No current outpatient prescriptions on file.   No current facility-administered medications for this visit.     Allergies-reviewed and updated Allergies  Allergen Reactions  . Penicillins Rash    Social History   Social History  . Marital status: Single    Spouse name: N/A  . Number of children: 3  . Years of education: 12   Occupational History  . Unemployed     Social History Main Topics  . Smoking status: Former Smoker    Packs/day: 0.25    Years: 5.00    Types: Cigarettes    Quit date: 09/10/2006  . Smokeless tobacco: Never Used  . Alcohol use Yes     Comment: occasional   . Drug use: No  . Sexual activity: Not Currently    Birth control/ protection: Condom     Comment: wants tubal   Other Topics Concern  . None   Social History Narrative   Lives at home with 3 children.   Close partner.    Objective:  Physical Exam: BP 136/76   Pulse 88   Temp 98.3 F  (36.8 C) (Oral)   Wt 223 lb (101.2 kg)   LMP 05/25/2016   SpO2 99%   BMI 40.79 kg/m   Gen: NAD, resting comfortably HEENT: MMM, OP clear.  CV: RRR with no murmurs appreciated Pulm: NWOB, CTAB with no crackles, wheezes, or rhonchi GI: Obese, normal bowel sounds present. Soft, Nontender, Nondistended. GU: Normal external female genitalia. Cervix visualized without lesions.  MSK: no edema, cyanosis, or clubbing noted Skin: warm, dry Neuro: CN2-12 intact. Patellar reflexes 2+ bilaterally. Strength 5/5 throughout.  Psych: Normal affect and thought content  Assessment/Plan:  Annual Exam Normal physical exam today. Discussed ways to work on weight. Pap checked today per patient request with STD screening as well. Up to date on healthcare maintenance recommendations. Follow up in 1 year.   Katina Degreealeb M. Jimmey RalphParker, MD [facility] Resident PGY-3 06/24/2016 9:48 AM

## 2016-06-24 NOTE — Patient Instructions (Addendum)
Your exam today was normal.  We will let you know the results of your pap.  Come back in 1 year for your next check up, or sooner as needed.  Take care,  Dr Jimmey RalphParker

## 2016-06-24 NOTE — Addendum Note (Signed)
Addended by: Steva ColderSCOTT, Teon Hudnall P on: 06/24/2016 11:17 AM   Modules accepted: Orders

## 2016-06-26 LAB — CYTOLOGY - PAP
Chlamydia: NEGATIVE
Diagnosis: NEGATIVE
HPV (WINDOPATH): NOT DETECTED
NEISSERIA GONORRHEA: NEGATIVE
Trichomonas: NEGATIVE

## 2016-06-27 ENCOUNTER — Encounter: Payer: Self-pay | Admitting: Family Medicine

## 2016-10-05 ENCOUNTER — Emergency Department (HOSPITAL_COMMUNITY): Payer: No Typology Code available for payment source

## 2016-10-05 ENCOUNTER — Encounter (HOSPITAL_COMMUNITY): Payer: Self-pay | Admitting: Emergency Medicine

## 2016-10-05 ENCOUNTER — Emergency Department (HOSPITAL_COMMUNITY)
Admission: EM | Admit: 2016-10-05 | Discharge: 2016-10-05 | Disposition: A | Discharge status: Home / Self Care | Payer: No Typology Code available for payment source | Attending: Emergency Medicine | Admitting: Emergency Medicine

## 2016-10-05 DIAGNOSIS — M542 Cervicalgia: Secondary | ICD-10-CM | POA: Insufficient documentation

## 2016-10-05 DIAGNOSIS — M791 Myalgia: Secondary | ICD-10-CM | POA: Diagnosis not present

## 2016-10-05 DIAGNOSIS — Z87891 Personal history of nicotine dependence: Secondary | ICD-10-CM | POA: Diagnosis not present

## 2016-10-05 DIAGNOSIS — M549 Dorsalgia, unspecified: Secondary | ICD-10-CM | POA: Diagnosis not present

## 2016-10-05 DIAGNOSIS — Z041 Encounter for examination and observation following transport accident: Secondary | ICD-10-CM | POA: Diagnosis not present

## 2016-10-05 MED ORDER — CYCLOBENZAPRINE HCL 5 MG PO TABS: 5.0000 mg | ORAL_TABLET | Freq: Three times a day (TID) | ORAL | 0 refills | Status: AC | PRN

## 2016-10-05 NOTE — ED Notes (Signed)
Pt verbalized understanding of d/c instructions and has no further questions. Pt is stable, A&Ox4, VSS.  
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
## 2017-01-08 ENCOUNTER — Other Ambulatory Visit: Payer: Self-pay

## 2017-01-08 ENCOUNTER — Encounter (HOSPITAL_COMMUNITY): Payer: Self-pay | Admitting: Emergency Medicine

## 2017-01-08 ENCOUNTER — Emergency Department (HOSPITAL_COMMUNITY)
Admission: EM | Admit: 2017-01-08 | Discharge: 2017-01-08 | Disposition: A | Discharge status: Home / Self Care | Payer: Medicaid Other | Attending: Emergency Medicine | Admitting: Emergency Medicine

## 2017-01-08 DIAGNOSIS — D573 Sickle-cell trait: Secondary | ICD-10-CM | POA: Diagnosis not present

## 2017-01-08 DIAGNOSIS — J020 Streptococcal pharyngitis: Secondary | ICD-10-CM | POA: Diagnosis not present

## 2017-01-08 DIAGNOSIS — J029 Acute pharyngitis, unspecified: Secondary | ICD-10-CM | POA: Diagnosis present

## 2017-01-08 DIAGNOSIS — Z9049 Acquired absence of other specified parts of digestive tract: Secondary | ICD-10-CM | POA: Insufficient documentation

## 2017-01-08 DIAGNOSIS — Z87891 Personal history of nicotine dependence: Secondary | ICD-10-CM | POA: Diagnosis not present

## 2017-01-08 LAB — RAPID STREP SCREEN (MED CTR MEBANE ONLY): Streptococcus, Group A Screen (Direct): POSITIVE — AB

## 2017-01-08 MED ORDER — CEPHALEXIN 250 MG PO CAPS
500.0000 mg | ORAL_CAPSULE | Freq: Once | ORAL | Status: AC
  Administered 2017-01-08: 500 mg via ORAL
  Filled 2017-01-08: qty 2

## 2017-01-08 MED ORDER — CEPHALEXIN 500 MG PO CAPS: 500.0000 mg | ORAL_CAPSULE | Freq: Four times a day (QID) | ORAL | 0 refills | Status: AC

## 2017-01-08 NOTE — Discharge Instructions (Signed)
Drink plenty of fluids Take Keflex twice a day for 10 days Take Ibuprofen or Tylenol for pain If you have trouble swallowing or symptoms are worsening, please return to the ED

## 2017-01-08 NOTE — ED Provider Notes (Signed)
[facility] Provider Note   CSN: [id number] Arrival date & time: 01/08/17  1944     History   Chief Complaint Chief Complaint  Patient presents with  . Sore Throat    HPI Kathryn Calderon is a 41 y.o. female who presents with a sore throat. She has had this for three days. She endorses painful swallowing, worse on the left. She is able to swallow. Her daughter tested positive for strep earlier today. Nothing makes it better or worse. She has not tried any medicines.  HPI  Past Medical History:  Diagnosis Date  . Sickle cell trait [facility])     Patient Active Problem List   Diagnosis Date Noted  . Menorrhagia with irregular cycle 05/30/2015  . Iron deficiency anemia 05/30/2015  . Health care maintenance 01/02/2015  . Allergic rhinitis 09/10/2011  . Obesity, unspecified 12/13/2007  . SICKLE CELL TRAIT 03/26/2006    Past Surgical History:  Procedure Laterality Date  . CHOLECYSTECTOMY  2000    OB History    Gravida Para Term Preterm AB Living   11 11 11     10    SAB TAB Ectopic Multiple Live Births           11       Home Medications    Prior to Admission medications   Medication Sig Start Date End Date Taking? Authorizing Provider  cephALEXin (KEFLEX) 500 MG capsule Take 1 capsule (500 mg total) by mouth 4 (four) times daily. 01/08/17   Bethel BornGekas, Leontyne [place] Marie, PA-C  cyclobenzaprine (FLEXERIL) 5 MG tablet Take 1 tablet (5 mg total) by mouth 3 (three) times daily as needed for muscle spasms. 10/05/16   Tillman Sersiccio, Angela C, DO    Family History Family History  Problem Relation Age of Onset  . Diabetes Maternal Grandmother   . Hypertension Maternal Grandfather     Social History Social History   Tobacco Use  . Smoking status: Former Smoker    Packs/day: 0.25    Years: 5.00    Pack years: 1.25    Types: Cigarettes    Last attempt to quit: 09/10/2006    Years since quitting: 10.3  . Smokeless tobacco: Never Used  Substance  Use Topics  . Alcohol use: Yes    Comment: occasional   . Drug use: No     Allergies   Penicillins   Review of Systems Review of Systems  Constitutional: Negative for fever.  HENT: Positive for sore throat.      Physical Exam Updated Vital Signs BP (!) 143/86 (BP Location: Right Arm)   Pulse 89   Temp 98.4 F (36.9 C) (Oral)   Resp 18   Ht 5\' 2"  (1.575 m)   Wt 101.2 kg (223 lb)   LMP 12/21/2016   SpO2 98%   BMI 40.79 kg/m   Physical Exam  Constitutional: She is oriented to person, place, and time. She appears well-developed and well-nourished. No distress.  HENT:  Head: Normocephalic and atraumatic.  Right Ear: Hearing, tympanic membrane, external ear and ear canal normal.  Left Ear: Hearing, tympanic membrane, external ear and ear canal normal.  Nose: Nose normal.  Mouth/Throat: Uvula is midline and mucous membranes are normal. Posterior oropharyngeal edema present. Tonsillar exudate.  Eyes: Conjunctivae are normal. Pupils are equal, round, and reactive to light. Right eye exhibits no discharge. Left eye exhibits no discharge. No scleral icterus.  Neck: Normal range of motion.  Cardiovascular: Normal rate and regular  rhythm. Exam reveals no gallop and no friction rub.  No murmur heard. Pulmonary/Chest: Effort normal and breath sounds normal. No stridor. No respiratory distress. She has no wheezes. She has no rales. She exhibits no tenderness.  Abdominal: She exhibits no distension.  Neurological: She is alert and oriented to person, place, and time.  Skin: Skin is warm and dry.  Psychiatric: She has a normal mood and affect. Her behavior is normal.  Nursing note and vitals reviewed.    ED Treatments / Results  Labs (all labs ordered are listed, but only abnormal results are displayed) Labs Reviewed  RAPID STREP SCREEN (NOT AT [facility]) - Abnormal; Notable for the following components:      Result Value   Streptococcus, Group A Screen (Direct) POSITIVE (*)     All other components within normal limits    EKG  EKG Interpretation None       Radiology No results found.  Procedures Procedures (including critical care time)  Medications Ordered in ED Medications  cephALEXin (KEFLEX) capsule 500 mg (not administered)     Initial Impression / Assessment and Plan / ED Course  I have reviewed the triage vital signs and the nursing notes.  Pertinent labs & imaging results that were available during my care of the patient were reviewed by me and considered in my medical decision making (see chart for details).  [age] year old with acute strep pharyngitis. Vitals are normal. Airway is intact. No difficulty swallowing but it is painful. She is allergic to PCN (rash). She was given Keflex in the ED and a 10 day course. Supportive care was discussed. She was given return precautions.  Final Clinical Impressions(s) / ED Diagnoses   Final diagnoses:  Strep pharyngitis    ED Discharge Orders        Ordered    cephALEXin (KEFLEX) 500 MG capsule  4 times daily     01/08/17 2141       Bethel BornGekas, Avion Patella Marie, PA-C 01/08/17 2212    Derwood KaplanNanavati, Ankit, MD 01/09/17 [phone]

## 2017-01-08 NOTE — ED Triage Notes (Signed)
Pt having sore throat for the past 3 days, very difficult to swallow her daughter test positive for strep yesterday.

## 2017-10-20 ENCOUNTER — Encounter (HOSPITAL_COMMUNITY): Payer: Self-pay | Admitting: Emergency Medicine

## 2017-10-20 ENCOUNTER — Other Ambulatory Visit: Payer: Self-pay

## 2017-10-20 ENCOUNTER — Emergency Department (HOSPITAL_COMMUNITY)
Admission: EM | Admit: 2017-10-20 | Discharge: 2017-10-20 | Disposition: A | Discharge status: Home / Self Care | Payer: Medicaid Other | Attending: Emergency Medicine | Admitting: Emergency Medicine

## 2017-10-20 DIAGNOSIS — J029 Acute pharyngitis, unspecified: Secondary | ICD-10-CM | POA: Diagnosis not present

## 2017-10-20 DIAGNOSIS — Z87891 Personal history of nicotine dependence: Secondary | ICD-10-CM | POA: Insufficient documentation

## 2017-10-20 DIAGNOSIS — Z79899 Other long term (current) drug therapy: Secondary | ICD-10-CM | POA: Diagnosis not present

## 2017-10-20 LAB — GROUP A STREP BY PCR: Group A Strep by PCR: NOT DETECTED

## 2017-10-20 MED ORDER — CLINDAMYCIN HCL 300 MG PO CAPS: 300.0000 mg | ORAL_CAPSULE | Freq: Four times a day (QID) | ORAL | 0 refills | Status: AC

## 2017-10-20 NOTE — ED Notes (Addendum)
Pt in [place].  States she is leaving because her ride is leaving.  Pt wanting someone to call her with results for strep screen.  Explained that we do not call results and encouraged pt to stay.  She states someone else told her they were waiting 6 hours so she has to leave due to ride.  Explained that pt is acuity 4 and next to go [place] and her wait will not be that long.  Pt again requesting results.  Explained MyChart but pt has not set up account yet.  Pt agreeable to stay for a little longer.

## 2017-10-20 NOTE — ED Triage Notes (Signed)
Pt reports sore throat, lower back pain and runny nose that started 2 days ago. Pt reports both of her children were diagnosed with strep throat.

## 2017-10-20 NOTE — ED Provider Notes (Signed)
[facility] EMERGENCY DEPARTMENT Provider Note   CSN: [id number] Arrival date & time: 10/20/17  1812     History   Chief Complaint Chief Complaint  Patient presents with  . Sore Throat    HPI Kathryn Calderon is a 42 y.o. female.  Kathryn Calderon is a 43 y.o. Female with a history of sickle cell trait, presents to the emergency department for evaluation of sore throat.  Patient reports for the past few days she has had sore throat.  Pain with swallowing.  She does report some associated nasal drainage and congestion, she has not really had much of a cough, denies ear pain.  No chest pain or shortness of breath, has had some body aches and mild nausea with no vomiting.  She denies fevers or chills.  She has not taken anything for her symptoms prior to arrival.  She does report that both of her children were recently diagnosed with strep throat and treated with antibiotics.  No other known sick contacts.     Past Medical History:  Diagnosis Date  . Sickle cell trait [facility])     Patient Active Problem List   Diagnosis Date Noted  . Menorrhagia with irregular cycle 05/30/2015  . Iron deficiency anemia 05/30/2015  . Health care maintenance 01/02/2015  . Allergic rhinitis 09/10/2011  . Obesity, unspecified 12/13/2007  . SICKLE CELL TRAIT 03/26/2006    Past Surgical History:  Procedure Laterality Date  . CHOLECYSTECTOMY  2000     OB History    Gravida  11   Para  11   Term  11   Preterm      AB      Living  10     SAB      TAB      Ectopic      Multiple      Live Births  11            Home Medications    Prior to Admission medications   Medication Sig Start Date End Date Taking? Authorizing Provider  cephALEXin (KEFLEX) 500 MG capsule Take 1 capsule (500 mg total) by mouth 4 (four) times daily. 01/08/17   Bethel Born, PA-C  clindamycin (CLEOCIN) 300 MG capsule Take 1 capsule (300 mg total) by mouth 4 (four) times daily.  X 7 days 10/20/17   Dartha Lodge, PA-C  cyclobenzaprine (FLEXERIL) 5 MG tablet Take 1 tablet (5 mg total) by mouth 3 (three) times daily as needed for muscle spasms. 10/05/16   Tillman Sers, DO    Family History Family History  Problem Relation Age of Onset  . Diabetes Maternal Grandmother   . Hypertension Maternal Grandfather     Social History Social History   Tobacco Use  . Smoking status: Former Smoker    Packs/day: 0.25    Years: 5.00    Pack years: 1.25    Types: Cigarettes    Last attempt to quit: 09/10/2006    Years since quitting: 11.1  . Smokeless tobacco: Never Used  Substance Use Topics  . Alcohol use: Yes    Comment: occasional   . Drug use: No     Allergies   Penicillins   Review of Systems Review of Systems  Constitutional: Negative for chills and fever.  HENT: Positive for congestion, postnasal drip, rhinorrhea, sore throat and trouble swallowing. Negative for ear pain.   Eyes: Negative for visual disturbance.  Respiratory: Negative for cough and shortness  of breath.   Cardiovascular: Negative for chest pain.  Gastrointestinal: Positive for nausea. Negative for abdominal pain and vomiting.  Musculoskeletal: Positive for myalgias. Negative for arthralgias and joint swelling.  Skin: Negative for color change and rash.  Neurological: Negative for syncope, light-headedness and headaches.     Physical Exam Updated Vital Signs BP 130/63   Pulse 83   Temp 98.8 F (37.1 C) (Oral)   Resp 16   Ht 5' 3.5" (1.613 m)   LMP 09/20/2017   SpO2 100%   BMI 38.88 kg/m   Physical Exam  Constitutional: She appears well-developed and well-nourished. No distress.  HENT:  Head: Normocephalic and atraumatic.  Right Ear: Tympanic membrane normal.  Left Ear: Tympanic membrane normal.  Mouth/Throat: Uvula is midline and mucous membranes are normal. No uvula swelling. Posterior oropharyngeal edema and posterior oropharyngeal erythema present. No oropharyngeal  exudate or tonsillar abscesses. Tonsils are 2+ on the right. Tonsils are 2+ on the left. No tonsillar exudate.  TMs clear with good landmarks, moderate nasal mucosa edema with clear rhinorrhea, posterior oropharynx erythematous with mild tonsillar edema to dates noted.  Uvula midline, no evidence of PTA or RPA, no trismus, tolerating secretions without difficulty  Eyes: Right eye exhibits no discharge. Left eye exhibits no discharge.  Neck: Normal range of motion. Neck supple.  No stridor, no lymphadenopathy  Cardiovascular: Normal rate, regular rhythm, normal heart sounds and intact distal pulses.  Pulmonary/Chest: Effort normal and breath sounds normal. No respiratory distress.  Respirations equal and unlabored, patient able to speak in full sentences, lungs clear to auscultation bilaterally  Abdominal: Soft. Bowel sounds are normal.  Abdomen soft, nondistended, nontender to palpation in all quadrants without guarding or peritoneal signs  Neurological: She is alert. Coordination normal.  Skin: Skin is warm and dry. Capillary refill takes less than 2 seconds. She is not diaphoretic.  Psychiatric: She has a normal mood and affect. Her behavior is normal.  Nursing note and vitals reviewed.    ED Treatments / Results  Labs (all labs ordered are listed, but only abnormal results are displayed) Labs Reviewed  GROUP A STREP BY PCR    EKG None  Radiology No results found.  Procedures Procedures (including critical care time)  Medications Ordered in ED Medications - No data to display   Initial Impression / Assessment and Plan / ED Course  I have reviewed the triage vital signs and the nursing notes.  Pertinent labs & imaging results that were available during my care of the patient were reviewed by me and considered in my medical decision making (see chart for details).  Patient with 2 days of sore throat, known contact with strep, also having some nasal congestion and rhinorrhea.   No cough, no chest pain or shortness of breath.  On exam she has edema and mild edema of the posterior oropharynx without tonsillar exudates, lungs are clear, abdomen is benign.  Patient with normal vitals.  Rapid strep is pending but patient reports she needs to leave to pick up her children.  Will prescribe clindamycin given known contact with strep and penicillin allergy.  Will contact patient with PCR results once they return.  Discussed appropriate symptomatic management.  PCP follow-up if symptoms not improving.  Return precautions discussed.  Stable for discharge home at this time.   Final Clinical Impressions(s) / ED Diagnoses   Final diagnoses:  Pharyngitis, unspecified etiology    ED Discharge Orders         Ordered  clindamycin (CLEOCIN) 300 MG capsule  4 times daily     10/20/17 2100           Dartha Lodge, PA-C 10/20/17 2251    Melene Plan, DO 10/20/17 2301

## 2017-10-20 NOTE — ED Notes (Addendum)
Patient is A&Ox4 at this time.  Patient in no signs of distress.  Please see providers note for complete history and physical exam.  

## 2017-10-20 NOTE — Discharge Instructions (Signed)
I will call you with the results of your strep test when it comes back, if it is positive please fill prescription for clindamycin otherwise this is likely due to a virus and you can treat your symptoms with Motrin and Tylenol for pain, Cepacol throat lozenges, Zyrtec and Flonase for nasal congestion.  If symptoms are persisting despite these efforts please follow-up with your primary care doctor.  Return to the emergency department for fevers, worsening sore throat, or any other new or concerning symptoms.

## 2017-10-20 NOTE — ED Notes (Signed)
Patient verbalizes understanding of discharge instructions. Opportunity for questioning and answers were provided. Armband removed by staff, pt discharged from ED.  

## 2018-06-23 ENCOUNTER — Ambulatory Visit: Payer: Medicaid Other

## 2018-06-28 ENCOUNTER — Ambulatory Visit: Payer: Medicaid Other

## 2019-09-25 ENCOUNTER — Emergency Department (HOSPITAL_COMMUNITY)
Admission: EM | Admit: 2019-09-25 | Discharge: 2019-09-28 | Disposition: E | Payer: Medicaid Other | Attending: Emergency Medicine | Admitting: Emergency Medicine

## 2019-09-25 ENCOUNTER — Emergency Department (HOSPITAL_COMMUNITY): Payer: Medicaid Other

## 2019-09-25 ENCOUNTER — Encounter (HOSPITAL_COMMUNITY): Payer: Self-pay | Admitting: Physician Assistant

## 2019-09-25 DIAGNOSIS — U071 COVID-19: Secondary | ICD-10-CM | POA: Diagnosis not present

## 2019-09-25 DIAGNOSIS — R464 Slowness and poor responsiveness: Secondary | ICD-10-CM | POA: Diagnosis present

## 2019-09-25 DIAGNOSIS — I469 Cardiac arrest, cause unspecified: Secondary | ICD-10-CM | POA: Diagnosis not present

## 2019-09-25 DIAGNOSIS — I4901 Ventricular fibrillation: Secondary | ICD-10-CM | POA: Insufficient documentation

## 2019-09-25 LAB — I-STAT CHEM 8, ED
BUN: 9 mg/dL (ref 6–20)
Calcium, Ion: 0.99 mmol/L — ABNORMAL LOW (ref 1.15–1.40)
Chloride: 97 mmol/L — ABNORMAL LOW (ref 98–111)
Creatinine, Ser: 1.7 mg/dL — ABNORMAL HIGH (ref 0.44–1.00)
Glucose, Bld: 352 mg/dL — ABNORMAL HIGH (ref 70–99)
HCT: 24 % — ABNORMAL LOW (ref 36.0–46.0)
Hemoglobin: 8.2 g/dL — ABNORMAL LOW (ref 12.0–15.0)
Potassium: 5.3 mmol/L — ABNORMAL HIGH (ref 3.5–5.1)
Sodium: 139 mmol/L (ref 135–145)
TCO2: 19 mmol/L — ABNORMAL LOW (ref 22–32)

## 2019-09-25 LAB — I-STAT VENOUS BLOOD GAS, ED
Acid-base deficit: 14 mmol/L — ABNORMAL HIGH (ref 0.0–2.0)
Bicarbonate: 17.6 mmol/L — ABNORMAL LOW (ref 20.0–28.0)
Calcium, Ion: 0.98 mmol/L — ABNORMAL LOW (ref 1.15–1.40)
HCT: 26 % — ABNORMAL LOW (ref 36.0–46.0)
Hemoglobin: 8.8 g/dL — ABNORMAL LOW (ref 12.0–15.0)
O2 Saturation: 31 %
Potassium: 5.3 mmol/L — ABNORMAL HIGH (ref 3.5–5.1)
Sodium: 140 mmol/L (ref 135–145)
TCO2: 20 mmol/L — ABNORMAL LOW (ref 22–32)
pCO2, Ven: 72.8 mmHg (ref 44.0–60.0)
pH, Ven: 6.99 — CL (ref 7.250–7.430)
pO2, Ven: 30 mmHg — CL (ref 32.0–45.0)

## 2019-09-25 LAB — SALICYLATE LEVEL: Salicylate Lvl: <7 mg/dL — ABNORMAL LOW (ref 7.0–30.0)

## 2019-09-25 LAB — COMPREHENSIVE METABOLIC PANEL
ALT: 31 U/L (ref 0–44)
AST: 52 U/L — ABNORMAL HIGH (ref 15–41)
Albumin: 1.9 g/dL — ABNORMAL LOW (ref 3.5–5.0)
Alkaline Phosphatase: 41 U/L (ref 38–126)
Anion gap: 30 — ABNORMAL HIGH (ref 5–15)
BUN: 9 mg/dL (ref 6–20)
CO2: 16 mmol/L — ABNORMAL LOW (ref 22–32)
Calcium: 8.8 mg/dL — ABNORMAL LOW (ref 8.9–10.3)
Chloride: 96 mmol/L — ABNORMAL LOW (ref 98–111)
Creatinine, Ser: 1.9 mg/dL — ABNORMAL HIGH (ref 0.44–1.00)
GFR calc Af Amer: 36 mL/min — ABNORMAL LOW (ref >60)
GFR calc non Af Amer: 31 mL/min — ABNORMAL LOW (ref >60)
Glucose, Bld: 400 mg/dL — ABNORMAL HIGH (ref 70–99)
Potassium: 5.9 mmol/L — ABNORMAL HIGH (ref 3.5–5.1)
Sodium: 142 mmol/L (ref 135–145)
Total Bilirubin: 0.9 mg/dL (ref 0.3–1.2)
Total Protein: 5.5 g/dL — ABNORMAL LOW (ref 6.5–8.1)

## 2019-09-25 LAB — CBC WITH DIFFERENTIAL/PLATELET
Abs Immature Granulocytes: 0.71 10*3/uL — ABNORMAL HIGH (ref 0.00–0.07)
Basophils Absolute: 0 10*3/uL (ref 0.0–0.1)
Basophils Relative: 0 %
Eosinophils Absolute: 0.3 10*3/uL (ref 0.0–0.5)
Eosinophils Relative: 2 %
HCT: 24.3 % — ABNORMAL LOW (ref 36.0–46.0)
Hemoglobin: 6.7 g/dL — CL (ref 12.0–15.0)
Immature Granulocytes: 6 %
Lymphocytes Relative: 21 %
Lymphs Abs: 2.5 10*3/uL (ref 0.7–4.0)
MCH: 19.6 pg — ABNORMAL LOW (ref 26.0–34.0)
MCHC: 27.6 g/dL — ABNORMAL LOW (ref 30.0–36.0)
MCV: 71.3 fL — ABNORMAL LOW (ref 80.0–100.0)
Monocytes Absolute: 0.6 10*3/uL (ref 0.1–1.0)
Monocytes Relative: 5 %
Neutro Abs: 8 10*3/uL — ABNORMAL HIGH (ref 1.7–7.7)
Neutrophils Relative %: 66 %
Platelets: 74 10*3/uL — ABNORMAL LOW (ref 150–400)
RBC: 3.41 MIL/uL — ABNORMAL LOW (ref 3.87–5.11)
RDW: 22.6 % — ABNORMAL HIGH (ref 11.5–15.5)
WBC Morphology: INCREASED
WBC: 12.1 10*3/uL — ABNORMAL HIGH (ref 4.0–10.5)
nRBC: 2.5 % — ABNORMAL HIGH (ref 0.0–0.2)

## 2019-09-25 LAB — PROTIME-INR
INR: 3.7 — ABNORMAL HIGH (ref 0.8–1.2)
Prothrombin Time: 35.4 seconds — ABNORMAL HIGH (ref 11.4–15.2)

## 2019-09-25 LAB — MAGNESIUM: Magnesium: 3 mg/dL — ABNORMAL HIGH (ref 1.7–2.4)

## 2019-09-25 LAB — ACETAMINOPHEN LEVEL: Acetaminophen (Tylenol), Serum: <10 ug/mL — ABNORMAL LOW (ref 10–30)

## 2019-09-25 LAB — SARS CORONAVIRUS 2 BY RT PCR (HOSPITAL ORDER, PERFORMED IN ~~LOC~~ HOSPITAL LAB): SARS Coronavirus 2: POSITIVE — AB

## 2019-09-25 LAB — APTT: aPTT: 63 seconds — ABNORMAL HIGH (ref 24–36)

## 2019-09-25 LAB — TROPONIN I (HIGH SENSITIVITY): Troponin I (High Sensitivity): 457 ng/L (ref <18)

## 2019-09-25 LAB — ETHANOL: Alcohol, Ethyl (B): <10 mg/dL (ref <10)

## 2019-09-25 MED ORDER — DEXTROSE 5 % IV SOLN
INTRAVENOUS | Status: AC | PRN
  Administered 2019-09-25: 300 mg via INTRAVENOUS

## 2019-09-25 MED ORDER — CALCIUM CHLORIDE 10 % IV SOLN
INTRAVENOUS | Status: AC | PRN
  Administered 2019-09-25: 1 g via INTRAVENOUS

## 2019-09-25 MED ORDER — EPINEPHRINE 1 MG/10ML IJ SOSY
PREFILLED_SYRINGE | INTRAMUSCULAR | Status: AC | PRN
  Administered 2019-09-25 (×5): 1 mg via INTRAVENOUS

## 2019-09-25 MED ORDER — SODIUM BICARBONATE 8.4 % IV SOLN
INTRAVENOUS | Status: AC | PRN
  Administered 2019-09-25 (×2): 50 meq via INTRAVENOUS

## 2019-09-25 MED ORDER — EPINEPHRINE 0.1 MG/10ML (10 MCG/ML) SYRINGE FOR IV PUSH (FOR BLOOD PRESSURE SUPPORT)
PREFILLED_SYRINGE | INTRAVENOUS | Status: AC | PRN
  Administered 2019-09-25: 40 ug via INTRAVENOUS
  Administered 2019-09-25: 50 ug via INTRAVENOUS
  Administered 2019-09-25: 40 ug via INTRAVENOUS
  Administered 2019-09-25: 100 ug via INTRAVENOUS

## 2019-09-25 MED ORDER — LIDOCAINE IN D5W 4-5 MG/ML-% IV SOLN
INTRAVENOUS | Status: AC | PRN
  Administered 2019-09-25: 150 mg/min via INTRAVENOUS

## 2019-09-25 MED ORDER — EPINEPHRINE 1 MG/10ML IJ SOSY
PREFILLED_SYRINGE | INTRAMUSCULAR | Status: AC | PRN
  Administered 2019-09-25: 1 mg via INTRAVENOUS

## 2019-09-25 MED ORDER — EPINEPHRINE 1 MG/10ML IJ SOSY
PREFILLED_SYRINGE | INTRAMUSCULAR | Status: AC | PRN
  Administered 2019-09-25 (×4): 1 mg via INTRAVENOUS

## 2019-09-25 MED ORDER — ETOMIDATE 2 MG/ML IV SOLN
INTRAVENOUS | Status: AC | PRN
  Administered 2019-09-25: 15 mg via INTRAVENOUS

## 2019-09-25 MED ORDER — ROCURONIUM BROMIDE 50 MG/5ML IV SOLN
INTRAVENOUS | Status: AC | PRN
  Administered 2019-09-25: 100 mg via INTRAVENOUS

## 2019-09-25 MED ORDER — MIDAZOLAM HCL 2 MG/2ML IJ SOLN
INTRAMUSCULAR | Status: AC
  Filled 2019-09-25: qty 2

## 2019-09-25 MED FILL — Medication: Qty: 1 | Status: AC
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
## 2019-09-25 NOTE — Code Documentation (Signed)
Patient remains asystole, CPR ongoing

## 2019-09-25 NOTE — Code Documentation (Signed)
AMIODARONE 150MG     PUSH

## 2019-09-25 NOTE — Code Documentation (Signed)
PIV infiltration so all meds being pushed through IO

## 2019-09-25 NOTE — ED Notes (Signed)
Notified Kim in patient placement.

## 2019-09-25 NOTE — ED Provider Notes (Addendum)
Progress note  Please see Dr. Haskel Schroeder note for full EDP documentation. Resuscitation was performed by both myself and Dr. Madilyn Hook. I was present on patient arrival. EMS reported hypoxia, agitation then arrest. PEA. Received epi and Versed. On arrival, bagging well through [facility] airway, patient was bradycardic, initially normotensive. Push dose epi for bradycardia, proceeded with endotracheal intubation. Good visualization of vocal cords, successful intubation. Post intubation, patient became hypotensive, remained bradycardic. Gave push dose epi, bicarb, calcium in case hyperkalemia was cause. No response in heart rate, started pacing. Was able to obtain capture at 120 mA, 70 bpm. Blood pressure improved. At this point in the resuscitation, Dr. Madilyn Hook was at bedside and she assumed the lead for the resuscitation. While pacing, lost capture and chest compressions were started. See Madilyn Hook' note for further documentation regarding the subsequent code. During CPR, I placed a right femoral central line.  Despite aggressive resuscitative efforts by myself and Dr. Madilyn Hook following ACLS protocols, patient expired.   CRITICAL CARE Performed by: Milagros Loll   Total critical care time: 51 minutes  Critical care time was exclusive of separately billable procedures and treating other patients.  Critical care was necessary to treat or prevent imminent or life-threatening deterioration.  Critical care was time spent personally by me on the following activities: development of treatment plan with patient and/or surrogate as well as nursing, discussions with consultants, evaluation of patient's response to treatment, examination of patient, obtaining history from patient or surrogate, ordering and performing treatments and interventions, ordering and review of laboratory studies, ordering and review of radiographic studies, pulse oximetry and re-evaluation of patient's condition.    Milagros Loll, MD 09/29/19  1518    Milagros Loll, MD 09/12/2019 [phone]

## 2019-09-25 NOTE — Code Documentation (Signed)
Patient time of death occurred at 9 by Dr. Stevie Kern.

## 2019-09-25 NOTE — ED Provider Notes (Signed)
Date/Time: 10-02-2019 2:34 PM Performed by: Milagros Loll, MD Induction Type: Rapid sequence Laryngoscope Size: Glidescope and 3 Grade View: Grade II Tube size: 7.5 mm Number of attempts: 1 Placement Confirmation: ETT inserted through vocal cords under direct vision and Breath sounds checked- equal and bilateral Secured at: 24 cm Tube secured with: ETT holder Comments: Preoxygenated through king airway. Received versed, etomidate, rocuronium for meds. Used video glidescope. Direct visualization of tube passing through cords. Post intubation b/l breath sounds present, no sound over stomach.     Marthenia Rolling Line  Date/Time: 02-Oct-2019 2:37 PM Performed by: Milagros Loll, MD Authorized by: Milagros Loll, MD   Consent:    Consent obtained:  Emergent situation Pre-procedure details:    Skin preparation:  2% chlorhexidine Procedure details:    Location:  R femoral   Site selection rationale:  During code   Patient position:  Flat   Procedural supplies:  Triple lumen   Catheter size:  7 Fr   Landmarks identified: yes     Ultrasound guidance: yes     Number of attempts:  2   Successful placement: yes   Post-procedure details:    Post-procedure:  Dressing applied and line sutured   Assessment:  Blood return through all ports   Patient tolerance of procedure:  Tolerated well, no immediate complications      Milagros Loll, MD 2019/10/02 1437

## 2019-09-25 NOTE — ED Notes (Signed)
Tab White is the father of the patients children and would like to be called when he can come and bring her some clothes and sit with her. [phone]

## 2019-09-25 NOTE — Progress Notes (Signed)
RT note: RT received patient from EMS with [facility] airway in place. RT and MD intubated patient with 7.5 et tube secured at 23 cm. BLBS and positive easy cap and ETCO2 with CPR. RT assisted with code and ventilation until MD pronounced time of death.

## 2019-09-25 NOTE — ED Provider Notes (Signed)
[facility] EMERGENCY DEPARTMENT Provider Note   CSN: [id number] Arrival date & time: 09/10/2019  1305     History No chief complaint on file.   Kathryn Calderon is a 44 y.o. female.  The history is provided by the EMS personnel. No language interpreter was used.   Kathryn Calderon is a 44 y.o. female who presents to the Emergency Department complaining of Cardiac Arrest. Level V caveat due to unresponsiveness. She presents the emergency department following cardiac arrest. EMS was called out for difficulty breathing and she was found to have sats in the 50s. She had witnessed cardiac arrest in the ambulance. She received five minutes of CPR and King airway was placed. She received five of Versed and epi prior to ED arrival. Please see separate ED physician note on initial time course on patient's ED arrival. [facility] airway was exchanged for endotracheal tube. ED two was confirmed with good air movement bilaterally. She did have pulses present on ED arrival.    History reviewed. No pertinent past medical history.  There are no problems to display for this patient.   History reviewed. No pertinent surgical history.   OB History   No obstetric history on file.     History reviewed. No pertinent family history.  Social History   Tobacco Use  . Smoking status: Not on file  Substance Use Topics  . Alcohol use: Not on file  . Drug use: Not on file    Home Medications Prior to Admission medications   Not on File    Allergies    Patient has no allergy information on record.  Review of Systems   Review of Systems  Unable to perform ROS: Patient unresponsive    Physical Exam Updated Vital Signs BP (!) 145/116   Pulse 69   Resp 18   SpO2 91%   Physical Exam Vitals and nursing note reviewed.  Constitutional:      General: She is in acute distress.     Appearance: She is well-developed. She is ill-appearing.  HENT:     Head: Normocephalic and  atraumatic.     Comments: Pupils fixed and dilated Cardiovascular:     Comments: No spontaneous cardiac activity. 2+ left femoral pulses with chest compressions. Pulmonary:     Comments: Good air movement bilaterally with bag valve mask ventilation via ET tube. Abdominal:     Palpations: Abdomen is soft.     Tenderness: There is no abdominal tenderness. There is no guarding or rebound.  Musculoskeletal:        General: No tenderness.     Comments: IO in right anterior tibia.  Skin:    General: Skin is warm and dry.  Neurological:     Comments: GCS 1-1-1  Psychiatric:     Comments: Unable to assess     ED Results / Procedures / Treatments   Labs (all labs ordered are listed, but only abnormal results are displayed) Labs Reviewed  PROTIME-INR - Abnormal; Notable for the following components:      Result Value   Prothrombin Time 35.4 (*)    INR 3.7 (*)    All other components within normal limits  APTT - Abnormal; Notable for the following components:   aPTT 63 (*)    All other components within normal limits  I-STAT CHEM 8, ED - Abnormal; Notable for the following components:   Potassium 5.3 (*)    Chloride 97 (*)    Creatinine, Ser 1.70 (*)  Glucose, Bld 352 (*)    Calcium, Ion 0.99 (*)    TCO2 19 (*)    Hemoglobin 8.2 (*)    HCT 24.0 (*)    All other components within normal limits  I-STAT VENOUS BLOOD GAS, ED - Abnormal; Notable for the following components:   pH, Ven 6.990 (*)    pCO2, Ven 72.8 (*)    pO2, Ven 30.0 (*)    Bicarbonate 17.6 (*)    TCO2 20 (*)    Acid-base deficit 14.0 (*)    Potassium 5.3 (*)    Calcium, Ion 0.98 (*)    HCT 26.0 (*)    Hemoglobin 8.8 (*)    All other components within normal limits  SARS CORONAVIRUS 2 BY RT PCR (HOSPITAL ORDER, PERFORMED IN [facility])  CULTURE, BLOOD (ROUTINE X 2)  CULTURE, BLOOD (ROUTINE X 2)  COMPREHENSIVE METABOLIC PANEL  CBC WITH DIFFERENTIAL/PLATELET  ETHANOL  ACETAMINOPHEN LEVEL    SALICYLATE LEVEL  LACTIC ACID, PLASMA  LACTIC ACID, PLASMA  URINALYSIS, ROUTINE W REFLEX MICROSCOPIC  RAPID URINE DRUG SCREEN, HOSP PERFORMED  MAGNESIUM  I-STAT BETA HCG BLOOD, ED ([facility], [facility], [facility] ONLY)  CBG MONITORING, ED  I-STAT ARTERIAL BLOOD GAS, ED  TROPONIN I (HIGH SENSITIVITY)    EKG EKG Interpretation  Date/Time:  Sunday September 25 2019 13:06:58 EDT Ventricular Rate:  42 PR Interval:    QRS Duration: 137 QT Interval:  477 QTC Calculation: 399 R Axis:   97 Text Interpretation: Junctional rhythm RBBB and LPFB Borderline ST depression, lateral leads ST elevation, consider inferior injury Confirmed by Marianna Fuss ([id number]) on 09/14/2019 1:46:22 PM   Radiology No results found.  Procedures Procedures (including critical care time) CRITICAL CARE Performed by: Tilden Fossa   Total critical care time: 45 minutes  Critical care time was exclusive of separately billable procedures and treating other patients.  Critical care was necessary to treat or prevent imminent or life-threatening deterioration.  Critical care was time spent personally by me on the following activities: development of treatment plan with patient and/or surrogate as well as nursing, discussions with consultants, evaluation of patient's response to treatment, examination of patient, obtaining history from patient or surrogate, ordering and performing treatments and interventions, ordering and review of laboratory studies, ordering and review of radiographic studies, pulse oximetry and re-evaluation of patient's condition.  Medications Ordered in ED Medications  midazolam (VERSED) 2 MG/2ML injection (has no administration in time range)  EPINEPhrine 10 mcg/mL Adult IV Push Syringe (For Blood Pressure Support) (100 mcg Intravenous Given 09/24/2019 1322)  calcium chloride injection (1 g Intravenous Given 09/27/2019 1313)  etomidate (AMIDATE) injection (15 mg Intravenous Given 09/22/2019 1315)  rocuronium  (ZEMURON) injection (100 mg Intravenous Given 09/01/2019 1315)  sodium bicarbonate injection (50 mEq Intravenous Given 09/22/2019 1341)  EPINEPHrine (ADRENALIN) 1 MG/10ML injection (1 mg Intravenous Given 09/15/2019 1344)  amiodarone (CORDARONE) 300 mg in dextrose 5 % 100 mL bolus (300 mg Intravenous New Bag/Given 09/20/2019 1334)  EPINEPHrine (ADRENALIN) 1 MG/10ML injection (1 mg Intravenous Given 09/04/2019 1349)  sodium bicarbonate injection (50 mEq Intravenous Given 09/27/2019 1359)  EPINEPHrine (ADRENALIN) 1 MG/10ML injection (1 mg Intravenous Given 09/21/2019 1410)  lidocaine (cardiac) 2000 mg in dextrose 5% 500 mL (4mg /mL) IV infusion (150 mg/min Intravenous New Bag/Given 09/08/2019 1409)    ED Course  I have reviewed the triage vital signs and the nursing notes.  Pertinent labs & imaging results that were available during my care of the patient were reviewed  by me and considered in my medical decision making (see chart for details).    MDM Rules/Calculators/A&P                         Pt presented following cardiac arrest of presumed respiratory origin. She did have return of circulation on ED arrival only to quickly lose circulation again. CPR was continued per ACS protocol. Please see separate nursing charting for details. During CPR she did have multiple episodes of V. fib as well as asystole and PEA. Entitle CO2 was between zero and 15 throughout the resuscitation. Despite aggressive care patient could not be resuscitated.   Attempted to contact patient's daughter, Otho Darner at [phone] - no answer.  Patient's significant other Tab White was contacted at [phone] notified of her death.  Brytni Dray was evaluated in Emergency Department on 2019-10-08 for the symptoms described in the history of present illness. She was evaluated in the context of the global COVID-19 pandemic, which necessitated consideration that the patient might be at risk for infection with the SARS-CoV-2 virus  that causes COVID-19. Institutional protocols and algorithms that pertain to the evaluation of patients at risk for COVID-19 are in a state of rapid change based on information released by regulatory bodies including the CDC and federal and state organizations. These policies and algorithms were followed during the patient's care in the ED.  Final Clinical Impression(s) / ED Diagnoses Final diagnoses:  Cardiac arrest [facility])  Ventricular fibrillation (HCC)  COVID-19 virus infection    Rx / DC Orders ED Discharge Orders    None       Tilden Fossa, MD Oct 08, 2019 1605

## 2019-09-28 DEATH — deceased

## 2019-11-17 ENCOUNTER — Encounter (HOSPITAL_COMMUNITY): Payer: Self-pay | Admitting: Emergency Medicine
